# Patient Record
Sex: Female | Born: 1995
Health system: Southern US, Community
[De-identification: ages and names within clinical notes are randomized; demographics above are authoritative.]

---
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
## 2017-02-04 DIAGNOSIS — L700 Acne vulgaris: Secondary | ICD-10-CM | POA: Diagnosis not present

## 2017-05-14 DIAGNOSIS — L700 Acne vulgaris: Secondary | ICD-10-CM | POA: Diagnosis not present

## 2017-09-28 DIAGNOSIS — J029 Acute pharyngitis, unspecified: Secondary | ICD-10-CM | POA: Diagnosis not present

## 2018-11-03 ENCOUNTER — Telehealth: Payer: Self-pay | Admitting: Family Medicine

## 2018-11-03 NOTE — Telephone Encounter (Signed)
Hailey, what do you mean? Pt needs appointment has not been seen since 2018.

## 2018-11-03 NOTE — Telephone Encounter (Signed)
Please advise on what to do with pt.

## 2018-11-03 NOTE — Telephone Encounter (Signed)
Spoke to pt's mother Melinda Newman, told her Dr. Juleen China and Aldona Bar are both out of the office this week and can offer you an appointment with one of our other providers due to having panic attacks and anxiety. [name] said she would like to wait for Dr. Juleen China next week, pt has not had an attack in a little while.Told her okay I will have to get permission from Dr. Juleen China to schedule her and I will call you back on Monday. [name] verbalized understanding. Told her if patient has a severe panic attack or increase in anxiety that she can't handle please go to the ED or Urgent care to be treated. [name] verbalized understanding.

## 2018-11-03 NOTE — Telephone Encounter (Signed)
Correct, but I dont know if pt needs triaged.     Borenstein, Melinda Newman (Patient) Liese, Melinda Newman (Patient) Appointment Scheduling - Scheduling Inquiry for Clinic  Summary: appt  Reason for CRM: mom calling to make an appt for the pt for Dr Juleen China. Advised pt was a pt of Samantha. Mom states they only made this appt w/ samnatha because pt needed cpe for college. Mom and the other daughter sees Dr Juleen China.  Mom states pt is having panic attacks and anxiety. Pt ist [employer]. Mom is wanting pt to come in person to see Dr Juleen China.  Please advise if ok for pt to switch. Pt will take off work when ok for appt  Mom states ok to call pt to schedule or mom. Mom is dpr.  Pt is wanting appt asap.

## 2018-11-05 NOTE — Telephone Encounter (Signed)
Pt's mother [name] called back, told her Dr. Juleen China can see Melinda Newman today at 12:30. [name] said she will have to contact her she lives in [place] and see if she can make it and call right back. Told her okay.

## 2018-11-05 NOTE — Telephone Encounter (Signed)
Spoke to [place] told her Dr. Juleen China does not have anything Monday but can see her Tuesday at 8:00 am. [name] said okay will take that time. Appt scheduled.

## 2018-11-05 NOTE — Telephone Encounter (Signed)
Left message on voicemail to call office on pt's mobile and mother's mobile.

## 2018-11-05 NOTE — Telephone Encounter (Signed)
[name] called back and pt can not make today but is off Monday & Tuesday. Told her I will call back with a time. [name] verbalized understanding.

## 2018-11-09 ENCOUNTER — Ambulatory Visit (INDEPENDENT_AMBULATORY_CARE_PROVIDER_SITE_OTHER): Payer: BC Managed Care – PPO | Admitting: Family Medicine

## 2018-11-09 ENCOUNTER — Other Ambulatory Visit: Payer: Self-pay

## 2018-11-09 ENCOUNTER — Encounter: Payer: Self-pay | Admitting: Family Medicine

## 2018-11-09 VITALS — BP 122/85 | HR 70 | Temp 98.6°F | Ht 64.0 in | Wt 117.8 lb

## 2018-11-09 DIAGNOSIS — F411 Generalized anxiety disorder: Secondary | ICD-10-CM | POA: Diagnosis not present

## 2018-11-09 DIAGNOSIS — F410 Panic disorder [episodic paroxysmal anxiety] without agoraphobia: Secondary | ICD-10-CM | POA: Diagnosis not present

## 2018-11-09 MED ORDER — HYDROXYZINE HCL 25 MG PO TABS: 25.0000 mg | ORAL_TABLET | Freq: Three times a day (TID) | ORAL | 0 refills | Status: DC | PRN

## 2018-11-09 MED ORDER — JUNEL 1.5/30 1.5-30 MG-MCG PO TABS: 1.0000 | ORAL_TABLET | Freq: Every day | ORAL | 2 refills | Status: DC

## 2018-11-09 MED ORDER — SERTRALINE HCL 25 MG PO TABS: 25.0000 mg | ORAL_TABLET | Freq: Every day | ORAL | 1 refills | Status: DC

## 2018-11-09 NOTE — Progress Notes (Signed)
Melinda Newman is a 23 y.o. female is here for follow up.  History of Present Illness:   Barnie MortJoEllen Thompson, CMA acting as scribe for Dr. Helane RimaErica Yomayra Tate.   HPI: Patient has started having increased anxiety. She has had history of anxiety in the past. She did have what she thinks was a panic attack last week. Symptoms have increased recently.  Hx of anxiety, but worse over the past year. Used to run for exercise, but has not been able to since new position. Still gets in 10,000 steps. Previously on OCPs for acne. Never followed up with Dermatology, so stopped them months ago.  [place] in [place] One year left of college. 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   Health Maintenance Due  Topic Date Due  . CHLAMYDIA SCREENING  09/07/2012  . HIV Screening  09/07/2012  . PAP-Cervical Cytology Screening  09/08/2018  . PAP SMEAR-Modifier  09/08/2018   [facility] 11/09/2018 10/23/2016  Decreased Interest 0 0  Down, Depressed, Hopeless 1 0  PHQ - 2 Score 1 0  Altered sleeping 2 -  Tired, decreased energy 1 -  Change in appetite 0 -  Feeling bad or failure about yourself  2 -  Trouble concentrating 1 -  Moving slowly or fidgety/restless 1 -  Suicidal thoughts 0 -  PHQ-9 Score 8 -  Difficult doing work/chores Somewhat difficult -   PMHx, SurgHx, SocialHx, FamHx, Medications, and Allergies were reviewed in the Visit Navigator and updated as appropriate.  There are no active problems to display for this patient.  Social History   Tobacco Use  . Smoking status: Never Smoker  . Smokeless tobacco: Never Used  Substance Use Topics  . Alcohol use: Yes    Comment: socially  . Drug use: No   Current Medications and Allergies   None  No Known Allergies   Review of Systems   Pertinent items are noted in the HPI. Otherwise, a complete ROS is negative.  Vitals   Vitals:   11/09/18 0802  BP: 122/85  Pulse: 70  Temp: 98.6 F (37 C)  TempSrc: Oral  SpO2: 100%  Weight:  117 lb 12.8 oz (53.4 kg)  Height: 5\' 4"  (1.626 m)     Body mass index is 20.22 kg/m.  Physical Exam   Physical Exam Vitals signs and nursing note reviewed.  HENT:     Head: Normocephalic and atraumatic.  Eyes:     Pupils: Pupils are equal, round, and reactive to light.  Neck:     Musculoskeletal: Normal range of motion and neck supple.  Cardiovascular:     Rate and Rhythm: Normal rate and regular rhythm.     Heart sounds: Normal heart sounds.  Pulmonary:     Effort: Pulmonary effort is normal.  Abdominal:     Palpations: Abdomen is soft.  Skin:    General: Skin is warm.  Psychiatric:        Behavior: Behavior normal.    Assessment and Plan   [name] was seen today for anxiety.  Diagnoses and all orders for this visit:  GAD (generalized anxiety disorder) -     sertraline (ZOLOFT) 25 MG tablet; Take 1 tablet (25 mg total) by mouth at bedtime. -     JUNEL 1.5/30 1.5-30 MG-MCG tablet; Take 1 tablet by mouth daily.  Panic attacks -     hydrOXYzine (ATARAX/VISTARIL) 25 MG tablet; Take 1 tablet (25 mg total) by mouth every 8 (eight) hours as  needed for anxiety.   . Orders and follow up as documented in [place], reviewed diet, exercise and weight control, cardiovascular risk and specific lipid/LDL goals reviewed, reviewed medications and side effects in detail.  . Reviewed expectations re: course of current medical issues. . Outlined signs and symptoms indicating need for more acute intervention. . Patient verbalized understanding and all questions were answered. . Patient received an After Visit Summary.  CMA served as Education administrator during this visit. History, Physical, and Plan performed by medical provider. The above documentation has been reviewed and is accurate and complete. Briscoe Deutscher, D.O.  Briscoe Deutscher, DO [place], [place] [facility] 11/09/2018

## 2018-11-09 NOTE — Patient Instructions (Addendum)
Meditation Apps for Beginners  When the body feels emotional or psychological stress, it releases cortisol -- a stress hormone. Some science shows that slowing down breathing can interrupt anxiety by helping you recognize unhealthy thought patterns.1 Meditation is one avenue for doing that.  #1. Calm was recommended by many ADDitude readers, who say it's kid-friendly and great for a quick mood boost. The app has a free trial and offers 7- and 21-day programs that focus on topics like anxiety, joy, and gratitude. To access the full library of content, which is also available on Android, subscribe for $69.99 per year (or $399.99 buys you a lifetime subscription).  To help you fall asleep, check out the app's "Sleep Stories" function that features celebrities reading calming tales; one reader says the app's 'Calm Kids' is really wonderful, too. New during "this time of uncertainly" the company has curated additional free meditation tools and other resources on its website. Calm is compatible with most Apple and Android mobile devices.  #2. Headspace is a popular app offering short (5 minutes) and long (20 minutes) meditations for adults and kids. The content is organized according to themes such as calm, focus, kindness, and sleep -- and it is geared to specific age groups. Start your day with a dose of optimism in the "The Wake Up" feature or make your workout more meditative with the new "Move Mode." Subscriptions cost $12.99 monthly or $69.99 annually with a free 7-day trial.  #3. Insight Timer allows users to customize their meditation practice with an advanced timer function that features a variety of beautiful sounds like singing bowls, wood blocks, and bell chime. Select the length of time that suits your needs and the type of meditation you're seeking: spiritual, Zen, or transcendental meditations; meditations for children; walking, breathing, sleep, and more. This paid app costs $59.99 annually with  a 7-day free trial.  #4. The 21-Day Meditation Experience was created by celebrities Drexel Ihaprah Winfrey and Rodell PernaDeepak Chopra to "eradicate fear and doubt, and renew a sense of trust and optimism about your life and your world." The app is free, but has received an overall score of 3.3 stars out of 5 with some reports of glitches prior to the latest version, released earlier this year.  #5. The Simple Habit app helps users develop a regular meditation practice in just 5 minutes a day. Choose from a [employer] Millscomprehensive library of meditations led by a variety of teachers including former monks, Glass blower/designermindfulness coaches, Engineer, structuralyoga practitioners, and psychologists. Simply close your eyes and listen or access meditations to help you improve focus, boost self-confidence, and calm your anxious mind. If you suffer from panic attacks, this app offers special meditations to overcome them. This paid app costs $89.99 a year and offers a 7-day free trial.  Apps for Art, Music, and Ashby Dawesature Sounds The therapeutic value of making art and music is well documented in research as well as ADDitude readers' anecdotal experiences with the following tools designed to induce calm and focus.  #6. Pixel Art is an art-making app that offers the mental stimulation and relaxation of a coloring book in a more social setting. "It's like color-by-numbers, but one square at a time and it has simple pictures as well as very detailed ones," said one ADDitude reader. Pixilart touts itself as a new kind of social networking "developed to expand the art of learning and networking." This app costs $7.99 a week with a 3-day free trial.  #7. Tide is a free sleep, focus, and  meditation app that uses nature sounds to erase worry. Haines, forest, ocean, rainy window, fire, and cicadas are among the selections inspired by the great outdoors. The app claims to help procrastinators who struggle to stay focused, creatives who are disturbed by a noisy environment,  stressed people fighting anxiety, and meditators striving for peace in body and mind. Access a selection of inspiring, daily quotes through the app as well.  #8. Weightless is not an app, but a 10-hour piece of music available for free on YouTube featuring piano, guitar, and samples of natural soundscapes combined in an otherworldly -- and very calming -- arrangement that claims to be the most relaxing song on the planet. "Weightless" was produced in 2012 by the [place] band [employer], which worked with sound therapists to develop this soothing instrumental designed to reduce anxiety, lower blood pressure, and moderate heart rate, according to Dr. Neita Goodnight, a neuroscientist who studied it. (Previous research has shown that music is known to stimulate the regions in the brain responsible for processing emotions as well as sounds.)  Apps for Better Sleep Hygiene An anxious mind can disrupt sleep, and insufficient rest is linked to a host of chronic and serious health conditions including diabetes, mood disorders, high blood pressure, and heart disease. Perhaps then it's no surprise that these sleep-inducing apps topped the list for several ADDitude readers.  #9. Adrian Prince is a sleep-tracking app that monitors motion and sound to evaluate the quality of your sleep. The app collects data as you sleep and shows periods during the night when you experience light and deep phases of sleep. It also analyzes how dietary calories, caffeine, and alcohol consumption impact the quality of your sleep. Pillow costs $4.49 a month, or $27.49 a year with a 7-day free trial.  #10. Good Morning Alarm Clock is an easy-to-use app that provides novel insight -- namely, your natural waking phase when you're most likely to be alert and energized -- to help you understand the quality of your sleep and track your sleep debt. The paid app offers relaxing sounds to help you drift off at the end of the day and it wakes you up in  the morning -- to a playlist of your favorite songs, if you like -- at the most optimal time. This app costs $4.99 to download in the [employer].

## 2018-11-11 ENCOUNTER — Ambulatory Visit: Payer: Self-pay

## 2018-11-11 NOTE — Telephone Encounter (Addendum)
Incoming call from Pt. Complaining of having a panic attack and passing out.  Pt.  Reports feeling weak anxious .  Pt.  Has been feeling this way since this morning.  Denies want to hurt  Herself or harm anyone else.  Patient reports she has a lot going on in her life.  Hat sopped drinking caffeine. Reports having two beers last night.   Denies substancabuse .Encouraged Pt. To decrease caffiene. No alcohol.  Call back if Sx worsen.   Reason for Disposition . [1] Started on anti-anxiety medication AND [2] no relief  Answer Assessment - Initial Assessment Questions 1. CONCERN: "What happened that made you call today?"    Panic attack 2. ANXIETY SYMPTOM SCREENING: "Can you describe how you have been feeling?"  (e.g., tense, restless, panicky, anxious, keyed up, trouble sleeping, trouble concentrating)     Weak anxous, nausous 3. ONSET: "How long have you been feeling this way?"     This morning 4. RECURRENT: "Have you felt this way before?"  If yes: "What happened that time?" "What helped these feelings go away in the past?"     no 5. RISK OF HARM - SUICIDAL IDEATION:  "Do you ever have thoughts of hurting or killing yourself?"  (e.g., yes, no, no but preoccupation with thoughts about death)   - INTENT:  "Do you have thoughts of hurting or killing yourself right NOW?" (e.g., yes, no, N/A)   - PLAN: "Do you have a specific plan for how you would do this?" (e.g., gun, knife, overdose, no plan, N/A)     denies 6. RISK OF HARM - HOMICIDAL IDEATION:  "Do you ever have thoughts of hurting or killing someone else?"  (e.g., yes, no, no but preoccupation with thoughts about death)   - INTENT:  "Do you have thoughts of hurting or killing someone right NOW?" (e.g., yes, no, N/A)   - PLAN: "Do you have a specific plan for how you would do this?" (e.g., gun, knife, no plan, N/A)      deni 7. FUNCTIONAL IMPAIRMENT: "How have things been going for you overall in your life? Have you had any more difficulties than  usual doing your normal daily activities?"  (e.g., better, same, worse; self-care, school, work, interactions)     8. SUPPORT: "Who is with you now?" "Who do you live with?" "Do you have family or friends nearby who you can talk to?"      *No Answer* 9. THERAPIST: "Do you have a counselor or therapist? Name?"     denies 10. STRESSORS: "Has there been any new stress or recent changes in your life?"       A lot going on yes 11. CAFFEINE ABUSE: "Do you drink caffeinated beverages, and how much each day?" (e.g., coffee, tea, colas)       I did but I stopped over a week ago 12. SUBSTANCE ABUSE: "Do you use any illegal drugs or alcohol?"       denies 13. OTHER SYMPTOMS: "Do you have any other physical symptoms right now?" (e.g., chest pain, palpitations, difficulty breathing, fever)       Heart racing at times 14. PREGNANCY: "Is there any chance you are pregnant?" "When was your last menstrual period?"       Day after Fathers day  Protocols used: ANXIETY AND PANIC ATTACK-A-AH

## 2018-11-11 NOTE — Telephone Encounter (Signed)
FYI  Any need for f/u with you?

## 2018-11-12 ENCOUNTER — Ambulatory Visit: Payer: Self-pay

## 2018-11-12 NOTE — Telephone Encounter (Signed)
Please call and offer virtual appointment at patient's earliest convenience.

## 2018-11-12 NOTE — Telephone Encounter (Signed)
Patient called and says she has been taking the Zoloft for a few days and it's not helping with her anxiety. She says she's still anxious to the point she can't work due to the anxiety. She says she doesn't want to do anything. She denies any thoughts of wanting to harm herself. She asks what should she do about it? I asked is she taking the Hydralazine, she says it makes her sleepy, so she can't take it and work. I advised some anti-depressant medications will take a week or longer to get into the system and level out the imbalances that cause anxiety/depression, advised that the office is closed and I cannot advised on stopping or switching the medication. I advised to go to an UC or ED if symptoms worsen or have any thoughts to harm herself, she verbalize understanding. I advised someone from the office will call with Melinda Newman's recommendation when the office opens, she verbalized understanding.  Reason for Disposition . Caller has NON-URGENT medication question about med that PCP prescribed and triager unable to answer question  Protocols used: MEDICATION QUESTION CALL-A-AH

## 2018-11-13 DIAGNOSIS — Z6820 Body mass index (BMI) 20.0-20.9, adult: Secondary | ICD-10-CM | POA: Diagnosis not present

## 2018-11-13 DIAGNOSIS — F410 Panic disorder [episodic paroxysmal anxiety] without agoraphobia: Secondary | ICD-10-CM | POA: Diagnosis not present

## 2018-11-15 ENCOUNTER — Ambulatory Visit: Payer: BC Managed Care – PPO | Admitting: Physician Assistant

## 2018-11-15 NOTE — Telephone Encounter (Signed)
Left message on pt's voicemail to call office. Then I called pt's mother [name] told her I am trying to get in touch with Mel Almond from the message on Friday. [name] said pt did go to Urgent care and she did stop the medication. [name] said she and the pt do not feel she needs to be on something everyday just want something to take if she needs it. Told her if she talks to [place] to schedule appt with Aldona Bar to discuss and I left a message for her also. [name] verbalized understanding.

## 2018-11-15 NOTE — Telephone Encounter (Signed)
Left voicemail message on patient's home phone and cell phone requesting a call back.

## 2018-11-15 NOTE — Telephone Encounter (Signed)
See note

## 2018-11-16 ENCOUNTER — Ambulatory Visit (INDEPENDENT_AMBULATORY_CARE_PROVIDER_SITE_OTHER): Payer: BC Managed Care – PPO | Admitting: Physician Assistant

## 2018-11-16 ENCOUNTER — Encounter: Payer: Self-pay | Admitting: Physician Assistant

## 2018-11-16 DIAGNOSIS — F411 Generalized anxiety disorder: Secondary | ICD-10-CM | POA: Diagnosis not present

## 2018-11-16 MED ORDER — PROPRANOLOL HCL 20 MG PO TABS: 20.0000 mg | ORAL_TABLET | Freq: Three times a day (TID) | ORAL | 0 refills | Status: DC | PRN

## 2018-11-16 NOTE — Progress Notes (Signed)
Virtual Visit via Video   I connected with Melinda Newman on 11/16/18 at  9:20 AM EDT by a video enabled telemedicine application and verified that I am speaking with the correct person using two identifiers. Location patient: Home Location provider: [facility], Office Persons participating in the virtual visit: Melinda Newman, Melinda Mosco PA-C.  I discussed the limitations of evaluation and management by telemedicine and the availability of in person appointments. The patient expressed understanding and agreed to proceed.  I acted as a Neurosurgeonscribe for Energy East CorporationSamantha Amaad Byers, PA-C Melinda Orphanos, LPN  Subjective:   HPI:   Anxiety Patient seen for anxiety in our office a few weeks ago. She was having panic attacks and was put on Zoloft 25 mg daily as well as prn Atarax. Since that time she has had ongoing panic attacks. She went to an urgent care on 11/13/18 and was told that it was okay to stop Zoloft. She felt like it may have been making her anxiety worse. She was told to continue to use Atarax prn and to follow-up with us.  She is interested in counseling.  ROS: See pertinent positives and negatives per HPI.  There are no active problems to display for this patient.   Social History   Tobacco Use  . Smoking status: Never Smoker  . Smokeless tobacco: Never Used  Substance Use Topics  . Alcohol use: Yes    Comment: socially    Current Outpatient Medications:  .  hydrOXYzine (ATARAX/VISTARIL) 25 MG tablet, Take 1 tablet (25 mg total) by mouth every 8 (eight) hours as needed for anxiety., Disp: 20 tablet, Rfl: 0 .  JUNEL 1.5/30 1.5-30 MG-MCG tablet, Take 1 tablet by mouth daily. (Patient not taking: Reported on 11/16/2018), Disp: 3 Package, Rfl: 2 .  propranolol (INDERAL) 20 MG tablet, Take 1 tablet (20 mg total) by mouth 3 (three) times daily as needed (anxiety)., Disp: 30 tablet, Rfl: 0  No Known Allergies  Objective:   VITALS: Per patient if applicable, see vitals.  GENERAL: Alert, appears well and in no acute distress. HEENT: Atraumatic, conjunctiva clear, no obvious abnormalities on inspection of external nose and ears. NECK: Normal movements of the head and neck. CARDIOPULMONARY: No increased WOB. Speaking in clear sentences. I:E ratio WNL.  MS: Moves all visible extremities without noticeable abnormality. PSYCH: Pleasant and cooperative, well-groomed. Speech normal rate and rhythm. Affect is appropriate. Insight and judgement are appropriate. Attention is focused, linear, and appropriate.  NEURO: CN grossly intact. Oriented as arrived to appointment on time with no prompting. Moves both UE equally.  SKIN: No obvious lesions, wounds, erythema, or cyanosis noted on face or hands.  Assessment and Plan:   Melinda Newman was seen today for anxiety.  Diagnoses and all orders for this visit:  GAD (generalized anxiety disorder) Denies SI/HI at this time. I discussed with patient that if they develop any SI, to tell someone immediately and seek medical attention. We discussed trialing propranolol 20 mg prn for her anxiety. She is interested in this. She also has not tried Atarax without also being on Zoloft and is wondering if taking this without Zoloft will make it less sedating -- discussed that she may trial this but also may be too sedating. Psychiatry referral placed per patient request. Follow-up with us within 1 month, sooner if needed, if referral takes longer than that.  Other orders -     propranolol (INDERAL) 20 MG tablet; Take 1 tablet (20 mg total) by mouth 3 (  three) times daily as needed (anxiety).   . Reviewed expectations re: course of current medical issues. . Discussed self-management of symptoms. . Outlined signs and symptoms indicating need for more acute intervention. . Patient verbalized understanding and all questions were answered. Marland Kitchen Health Maintenance issues including appropriate healthy diet, exercise, and smoking avoidance were discussed  with patient. . See orders for this visit as documented in the electronic medical record.  I discussed the assessment and treatment plan with the patient. The patient was provided an opportunity to ask questions and all were answered. The patient agreed with the plan and demonstrated an understanding of the instructions.   The patient was advised to call back or seek an in-person evaluation if the symptoms worsen or if the condition fails to improve as anticipated.   CMA or LPN served as scribe during this visit. History, Physical, and Plan performed by medical provider. The above documentation has been reviewed and is accurate and complete.  Melinda Newman, [place] 11/16/2018

## 2018-12-04 DIAGNOSIS — Z6820 Body mass index (BMI) 20.0-20.9, adult: Secondary | ICD-10-CM | POA: Diagnosis not present

## 2018-12-04 DIAGNOSIS — R0789 Other chest pain: Secondary | ICD-10-CM | POA: Diagnosis not present

## 2018-12-04 DIAGNOSIS — R079 Chest pain, unspecified: Secondary | ICD-10-CM | POA: Diagnosis not present

## 2018-12-04 DIAGNOSIS — F419 Anxiety disorder, unspecified: Secondary | ICD-10-CM | POA: Diagnosis not present

## 2018-12-04 DIAGNOSIS — I4519 Other right bundle-branch block: Secondary | ICD-10-CM | POA: Diagnosis not present

## 2018-12-09 DIAGNOSIS — F064 Anxiety disorder due to known physiological condition: Secondary | ICD-10-CM | POA: Diagnosis not present

## 2018-12-09 DIAGNOSIS — F410 Panic disorder [episodic paroxysmal anxiety] without agoraphobia: Secondary | ICD-10-CM | POA: Diagnosis not present

## 2018-12-16 DIAGNOSIS — F064 Anxiety disorder due to known physiological condition: Secondary | ICD-10-CM | POA: Diagnosis not present

## 2018-12-16 DIAGNOSIS — F410 Panic disorder [episodic paroxysmal anxiety] without agoraphobia: Secondary | ICD-10-CM | POA: Diagnosis not present

## 2018-12-23 DIAGNOSIS — F064 Anxiety disorder due to known physiological condition: Secondary | ICD-10-CM | POA: Diagnosis not present

## 2018-12-23 DIAGNOSIS — F410 Panic disorder [episodic paroxysmal anxiety] without agoraphobia: Secondary | ICD-10-CM | POA: Diagnosis not present

## 2018-12-28 DIAGNOSIS — F419 Anxiety disorder, unspecified: Secondary | ICD-10-CM | POA: Diagnosis not present

## 2018-12-30 DIAGNOSIS — F064 Anxiety disorder due to known physiological condition: Secondary | ICD-10-CM | POA: Diagnosis not present

## 2018-12-30 DIAGNOSIS — F410 Panic disorder [episodic paroxysmal anxiety] without agoraphobia: Secondary | ICD-10-CM | POA: Diagnosis not present

## 2019-01-06 DIAGNOSIS — F064 Anxiety disorder due to known physiological condition: Secondary | ICD-10-CM | POA: Diagnosis not present

## 2019-01-06 DIAGNOSIS — F410 Panic disorder [episodic paroxysmal anxiety] without agoraphobia: Secondary | ICD-10-CM | POA: Diagnosis not present

## 2019-01-13 DIAGNOSIS — F064 Anxiety disorder due to known physiological condition: Secondary | ICD-10-CM | POA: Diagnosis not present

## 2019-01-13 DIAGNOSIS — F410 Panic disorder [episodic paroxysmal anxiety] without agoraphobia: Secondary | ICD-10-CM | POA: Diagnosis not present

## 2019-01-20 DIAGNOSIS — F064 Anxiety disorder due to known physiological condition: Secondary | ICD-10-CM | POA: Diagnosis not present

## 2019-01-20 DIAGNOSIS — F410 Panic disorder [episodic paroxysmal anxiety] without agoraphobia: Secondary | ICD-10-CM | POA: Diagnosis not present

## 2019-01-30 DIAGNOSIS — F064 Anxiety disorder due to known physiological condition: Secondary | ICD-10-CM | POA: Diagnosis not present

## 2019-01-30 DIAGNOSIS — F410 Panic disorder [episodic paroxysmal anxiety] without agoraphobia: Secondary | ICD-10-CM | POA: Diagnosis not present

## 2019-02-10 DIAGNOSIS — F064 Anxiety disorder due to known physiological condition: Secondary | ICD-10-CM | POA: Diagnosis not present

## 2019-02-10 DIAGNOSIS — F410 Panic disorder [episodic paroxysmal anxiety] without agoraphobia: Secondary | ICD-10-CM | POA: Diagnosis not present

## 2019-02-17 DIAGNOSIS — J039 Acute tonsillitis, unspecified: Secondary | ICD-10-CM | POA: Diagnosis not present

## 2019-02-22 DIAGNOSIS — J038 Acute tonsillitis due to other specified organisms: Secondary | ICD-10-CM | POA: Diagnosis not present

## 2019-02-24 DIAGNOSIS — F410 Panic disorder [episodic paroxysmal anxiety] without agoraphobia: Secondary | ICD-10-CM | POA: Diagnosis not present

## 2019-02-24 DIAGNOSIS — F064 Anxiety disorder due to known physiological condition: Secondary | ICD-10-CM | POA: Diagnosis not present

## 2019-03-15 DIAGNOSIS — J029 Acute pharyngitis, unspecified: Secondary | ICD-10-CM | POA: Diagnosis not present

## 2019-03-15 DIAGNOSIS — R0982 Postnasal drip: Secondary | ICD-10-CM | POA: Diagnosis not present

## 2019-04-06 ENCOUNTER — Encounter (HOSPITAL_BASED_OUTPATIENT_CLINIC_OR_DEPARTMENT_OTHER): Payer: Self-pay

## 2019-04-06 ENCOUNTER — Emergency Department (HOSPITAL_BASED_OUTPATIENT_CLINIC_OR_DEPARTMENT_OTHER): Payer: BC Managed Care – PPO

## 2019-04-06 ENCOUNTER — Other Ambulatory Visit: Payer: Self-pay

## 2019-04-06 ENCOUNTER — Emergency Department (HOSPITAL_BASED_OUTPATIENT_CLINIC_OR_DEPARTMENT_OTHER)
Admission: EM | Admit: 2019-04-06 | Discharge: 2019-04-07 | Disposition: A | Discharge status: Home / Self Care | Payer: BC Managed Care – PPO | Attending: Emergency Medicine | Admitting: Emergency Medicine

## 2019-04-06 DIAGNOSIS — R079 Chest pain, unspecified: Secondary | ICD-10-CM | POA: Diagnosis not present

## 2019-04-06 DIAGNOSIS — R9431 Abnormal electrocardiogram [ECG] [EKG]: Secondary | ICD-10-CM | POA: Diagnosis not present

## 2019-04-06 DIAGNOSIS — J982 Interstitial emphysema: Secondary | ICD-10-CM | POA: Insufficient documentation

## 2019-04-06 LAB — CBC WITH DIFFERENTIAL/PLATELET
Abs Immature Granulocytes: 0.02 10*3/uL (ref 0.00–0.07)
Basophils Absolute: 0 10*3/uL (ref 0.0–0.1)
Basophils Relative: 0 %
Eosinophils Absolute: 0.1 10*3/uL (ref 0.0–0.5)
Eosinophils Relative: 1 %
HCT: 38.4 % (ref 36.0–46.0)
Hemoglobin: 13 g/dL (ref 12.0–15.0)
Immature Granulocytes: 0 %
Lymphocytes Relative: 21 %
Lymphs Abs: 2.2 10*3/uL (ref 0.7–4.0)
MCH: 30.8 pg (ref 26.0–34.0)
MCHC: 33.9 g/dL (ref 30.0–36.0)
MCV: 91 fL (ref 80.0–100.0)
Monocytes Absolute: 1 10*3/uL (ref 0.1–1.0)
Monocytes Relative: 9 %
Neutro Abs: 7.1 10*3/uL (ref 1.7–7.7)
Neutrophils Relative %: 69 %
Platelets: 366 10*3/uL (ref 150–400)
RBC: 4.22 MIL/uL (ref 3.87–5.11)
RDW: 11.9 % (ref 11.5–15.5)
WBC: 10.4 10*3/uL (ref 4.0–10.5)
nRBC: 0 % (ref 0.0–0.2)

## 2019-04-06 LAB — BASIC METABOLIC PANEL
Anion gap: 6 (ref 5–15)
BUN: 9 mg/dL (ref 6–20)
CO2: 24 mmol/L (ref 22–32)
Calcium: 9.4 mg/dL (ref 8.9–10.3)
Chloride: 109 mmol/L (ref 98–111)
Creatinine, Ser: 0.74 mg/dL (ref 0.44–1.00)
GFR calc Af Amer: >60 mL/min (ref >60)
GFR calc non Af Amer: >60 mL/min (ref >60)
Glucose, Bld: 103 mg/dL — ABNORMAL HIGH (ref 70–99)
Potassium: 4 mmol/L (ref 3.5–5.1)
Sodium: 139 mmol/L (ref 135–145)

## 2019-04-06 MED ORDER — LORAZEPAM 2 MG/ML IJ SOLN
1.0000 mg | Freq: Once | INTRAMUSCULAR | Status: AC
  Administered 2019-04-06: 1 mg via INTRAVENOUS
  Filled 2019-04-06: qty 1

## 2019-04-06 MED ORDER — IOHEXOL 300 MG/ML  SOLN
100.0000 mL | Freq: Once | INTRAMUSCULAR | Status: AC | PRN
  Administered 2019-04-07: 80 mL via INTRAVENOUS

## 2019-04-06 MED ORDER — MORPHINE SULFATE (PF) 4 MG/ML IV SOLN
4.0000 mg | Freq: Once | INTRAVENOUS | Status: AC
  Administered 2019-04-06: 4 mg via INTRAVENOUS
  Filled 2019-04-06: qty 1

## 2019-04-06 NOTE — ED Provider Notes (Signed)
[facility] EMERGENCY DEPARTMENT Provider Note   CSN: [id number] Arrival date & time: 04/06/19  2102     History   Chief Complaint Chief Complaint  Patient presents with  . Chest Pain    HPI Melinda Newman is a 23 y.o. female.     HPI   23 year old female with chest pain.  Onset around 3 PM while driving her car and eating cheese its.  She describes pain in the center of her chest coming up into her neck.  She feels fine when she is completely still.  Somewhat worse with movement in general and sniffily worse with deep inhalation.  She does not feel short of breath.  No fevers or chills.  No coughing.  No unusual leg pain or swelling.  Is a past history of anxiety.  She tried taking dose of propranolol without improvement of her symptoms.  She did not feel like her heart was racing though.  History reviewed. No pertinent past medical history.  There are no active problems to display for this patient.   History reviewed. No pertinent surgical history.   OB History   No obstetric history on file.      Home Medications    Prior to Admission medications   Medication Sig Start Date End Date Taking? Authorizing Provider  hydrOXYzine (ATARAX/VISTARIL) 25 MG tablet Take 1 tablet (25 mg total) by mouth every 8 (eight) hours as needed for anxiety. 11/09/18   Helane Rima, DO  JUNEL 1.5/30 1.5-30 MG-MCG tablet Take 1 tablet by mouth daily. Patient not taking: Reported on 11/16/2018 11/09/18   Helane Rima, DO  propranolol (INDERAL) 20 MG tablet Take 1 tablet (20 mg total) by mouth 3 (three) times daily as needed (anxiety). 11/16/18   Jarold Motto, PA    Family History Family History  Problem Relation Age of Onset  . Multiple sclerosis Mother   . Stroke Maternal Grandmother   . Skin cancer Maternal Grandfather   . Diabetes Maternal Grandfather   . Heart disease Maternal Grandfather   . Skin cancer Paternal Grandmother   . Skin cancer Paternal Grandfather    . Colon cancer Neg Hx   . Breast cancer Neg Hx     Social History Social History   Tobacco Use  . Smoking status: Never Smoker  . Smokeless tobacco: Never Used  Substance Use Topics  . Alcohol use: Yes    Comment: occ  . Drug use: No     Allergies   Patient has no known allergies.   Review of Systems Review of Systems  All systems reviewed and negative, other than as noted in HPI.  Physical Exam Updated Vital Signs BP 109/77 (BP Location: Right Arm)   Pulse 62   Temp 98.8 F (37.1 C) (Oral)   Resp 18   Ht 5\' 4"  (1.626 m)   Wt 52.2 kg   SpO2 100%   BMI 19.74 kg/m   Physical Exam Vitals signs and nursing note reviewed.  Constitutional:      General: She is not in acute distress.    Appearance: She is well-developed.  HENT:     Head: Normocephalic and atraumatic.  Eyes:     General:        Right eye: No discharge.        Left eye: No discharge.     Conjunctiva/sclera: Conjunctivae normal.  Neck:     Musculoskeletal: Neck supple.  Cardiovascular:     Rate and Rhythm: Normal rate  and regular rhythm.     Heart sounds: Normal heart sounds. No murmur. No friction rub. No gallop.   Pulmonary:     Effort: Pulmonary effort is normal. No respiratory distress.     Breath sounds: Normal breath sounds.  Abdominal:     General: There is no distension.     Palpations: Abdomen is soft.     Tenderness: There is no abdominal tenderness.  Musculoskeletal:        General: No tenderness.  Skin:    General: Skin is warm and dry.  Neurological:     Mental Status: She is alert.  Psychiatric:        Behavior: Behavior normal.        Thought Content: Thought content normal.    ED Treatments / Results  Labs (all labs ordered are listed, but only abnormal results are displayed) Labs Reviewed  CBC WITH DIFFERENTIAL/PLATELET  BASIC METABOLIC PANEL    EKG EKG Interpretation  Date/Time:  Wednesday April 06 2019 21:12:49 EST Ventricular Rate:  65 PR Interval:   138 QRS Duration: 104 QT Interval:  370 QTC Calculation: 384 R Axis:   45 Text Interpretation: Normal sinus rhythm Incomplete right bundle branch block Cannot rule out Anterior infarct , age undetermined Abnormal ECG Confirmed by Virgel Manifold [id number]) on 04/06/2019 9:21:30 PM   Radiology No results found.  Procedures Procedures (including critical care time)  Medications Ordered in ED Medications  LORazepam (ATIVAN) injection 1 mg (has no administration in time range)  morphine 4 MG/ML injection 4 mg (has no administration in time range)     Initial Impression / Assessment and Plan / ED Course  I have reviewed the triage vital signs and the nursing notes.  Pertinent labs & imaging results that were available during my care of the patient were reviewed by me and considered in my medical decision making (see chart for details).    23yF with chest pain.  Interestingly she has spontaneous pneumomediastinum. I can't readily identify a clear precipitant though.  Denies any significant coughing, retching or trauma.  Smokes very rarely when she drinks alcohol. Denies any vaping/drug use. Says she was just driving in her car eating cheez-its when she first noticed it. No gagging, severe pain, etc. I really doubt she perforated her esophagus. Aside from anxiety, denies any significant medical issues.  On exam she appears well. WOB is not increased and she seems comfortable at rest. o2 sats normal on RA. No chest/neck crepitus or tenderness. Lungs sound clear.   Will CT to further evaluate. Care signed out to Dr [facility] with CT pending.   Final Clinical Impressions(s) / ED Diagnoses   Final diagnoses:  Pneumomediastinum [facility])    ED Discharge Orders    None       Virgel Manifold, MD 04/07/19 0002

## 2019-04-06 NOTE — ED Triage Notes (Addendum)
Pt c/o CP since 3pm while driving-pt states she has hx of same with dx anxiety-pt states she took propanolol and advil PTA-denies fever/flu like sx-NAD-steady gait

## 2019-04-06 NOTE — ED Notes (Signed)
Pt reports worsening of pain. Has not seen EDP yet, apologized for wait and explained.

## 2019-04-07 ENCOUNTER — Emergency Department (HOSPITAL_BASED_OUTPATIENT_CLINIC_OR_DEPARTMENT_OTHER): Payer: BC Managed Care – PPO

## 2019-04-07 DIAGNOSIS — J982 Interstitial emphysema: Secondary | ICD-10-CM | POA: Diagnosis not present

## 2019-04-07 MED ORDER — HYDROCODONE-ACETAMINOPHEN 5-325 MG PO TABS: 1.0000 | ORAL_TABLET | ORAL | 0 refills | Status: DC | PRN

## 2019-04-07 MED ORDER — HYDROCODONE-ACETAMINOPHEN 5-325 MG PO TABS
1.0000 | ORAL_TABLET | Freq: Once | ORAL | Status: AC
  Administered 2019-04-07: 1 via ORAL
  Filled 2019-04-07: qty 1

## 2019-04-07 NOTE — ED Notes (Signed)
Patient transported to CT 

## 2019-04-07 NOTE — ED Provider Notes (Signed)
Nursing notes and vitals signs, including pulse oximetry, reviewed.  Summary of this visit's results, reviewed by myself:  EKG:  EKG Interpretation  Date/Time:  Wednesday April 06 2019 21:12:49 EST Ventricular Rate:  65 PR Interval:  138 QRS Duration: 104 QT Interval:  370 QTC Calculation: 384 R Axis:   45 Text Interpretation: Normal sinus rhythm Incomplete right bundle branch block Cannot rule out Anterior infarct , age undetermined Abnormal ECG Confirmed by Raeford Razor [phone]) on 04/06/2019 9:21:30 PM       Labs:  Results for orders placed or performed during the hospital encounter of 04/06/19 (from the past 24 hour(s))  CBC with Differential     Status: None   Collection Time: 04/06/19 11:31 PM  Result Value Ref Range   WBC 10.4 4.0 - 10.5 K/uL   RBC 4.22 3.87 - 5.11 MIL/uL   Hemoglobin 13.0 12.0 - 15.0 g/dL   HCT 37.1 06.2 - 69.4 %   MCV 91.0 80.0 - 100.0 fL   MCH 30.8 26.0 - 34.0 pg   MCHC 33.9 30.0 - 36.0 g/dL   RDW 85.4 62.7 - 03.5 %   Platelets 366 150 - 400 K/uL   nRBC 0.0 0.0 - 0.2 %   Neutrophils Relative % 69 %   Neutro Abs 7.1 1.7 - 7.7 K/uL   Lymphocytes Relative 21 %   Lymphs Abs 2.2 0.7 - 4.0 K/uL   Monocytes Relative 9 %   Monocytes Absolute 1.0 0.1 - 1.0 K/uL   Eosinophils Relative 1 %   Eosinophils Absolute 0.1 0.0 - 0.5 K/uL   Basophils Relative 0 %   Basophils Absolute 0.0 0.0 - 0.1 K/uL   Immature Granulocytes 0 %   Abs Immature Granulocytes 0.02 0.00 - 0.07 K/uL  Basic metabolic panel     Status: Abnormal   Collection Time: 04/06/19 11:31 PM  Result Value Ref Range   Sodium 139 135 - 145 mmol/L   Potassium 4.0 3.5 - 5.1 mmol/L   Chloride 109 98 - 111 mmol/L   CO2 24 22 - 32 mmol/L   Glucose, Bld 103 (H) 70 - 99 mg/dL   BUN 9 6 - 20 mg/dL   Creatinine, Ser 0.09 0.44 - 1.00 mg/dL   Calcium 9.4 8.9 - 38.1 mg/dL   GFR calc non Af Amer >60 >60 mL/min   GFR calc Af Amer >60 >60 mL/min   Anion gap 6 5 - 15    Imaging Studies: Dg  Chest 2 View  Result Date: 04/06/2019 CLINICAL DATA:  Sternal chest pain, initial encounter EXAM: CHEST - 2 VIEW COMPARISON:  None. FINDINGS: Cardiac shadow is within normal limits. There are changes consistent with pneumomediastinum without evidence of pneumothorax. The lungs are clear. No bony abnormality is noted. IMPRESSION: Changes consistent with pneumomediastinum radiating into the neck. This may be related to barotrauma or recent vomiting. Clinical correlation is recommended. CT may be helpful for further evaluation. Critical Value/emergent results were called by telephone at the time of interpretation on 04/06/2019 at 10:52 pm to Dr. Raeford Razor , who verbally acknowledged these results. Electronically Signed   By: Alcide Clever M.D.   On: 04/06/2019 22:50   Ct Chest Wo Contrast  Result Date: 04/07/2019 CLINICAL DATA:  Known pneumomediastinum, evaluate for possible esophageal injury EXAM: CT CHEST WITHOUT CONTRAST TECHNIQUE: Multidetector CT imaging of the chest was performed following the standard protocol without IV contrast. Additionally Gastrografin was administered orally during the exam. COMPARISON:  Contrast enhanced CT from  earlier in the same day. FINDINGS: Cardiovascular: Cardiac shadow is within normal limits. No IV contrast was administered for this exam. No other focal vascular abnormality is noted. Mediastinum/Nodes: Pneumomediastinum is again identified. The esophagus shows dense Gastrografin contrast within although no area of extravasation is identified. Repeat imaging to include the entire gastroesophageal junction was performed. These images show no extravasation of contrast material. Lungs/Pleura: The lungs are again well aerated without focal infiltrate or sizable effusion. No pneumothorax is seen. Upper Abdomen: Visualized upper abdomen is limited due to the lack of IV contrast. The stomach is full of ingested food stuffs. Musculoskeletal: No acute bony abnormality is noted.  IMPRESSION: No evidence of significant extravasation of ingested contrast to suggest esophageal injury. Pneumomediastinum is again identified and stable. Electronically Signed   By: Inez Catalina M.D.   On: 04/07/2019 01:49   Ct Chest W Contrast  Result Date: 04/07/2019 CLINICAL DATA:  Pneumomediastinum on recent chest x-ray EXAM: CT CHEST WITH CONTRAST TECHNIQUE: Multidetector CT imaging of the chest was performed during intravenous contrast administration. CONTRAST:  71mL OMNIPAQUE IOHEXOL 300 MG/ML  SOLN COMPARISON:  Chest x-ray from the previous day FINDINGS: Cardiovascular: Thoracic aorta and its branches are within normal limits. No aneurysmal dilatation is seen. No cardiac enlargement is noted. The pulmonary artery as visualized is within normal limits. Mediastinum/Nodes: The thoracic inlet demonstrates considerable subcutaneous emphysema consistent with the pneumomediastinum seen on recent chest x-ray. No mediastinal hematoma is seen. No hilar or mediastinal adenopathy is noted. Pneumomediastinum is noted as well along the esophagus as well as the anterior mediastinum. No definitive esophageal abnormality is seen. No significant extension of the pneumomediastinum along the bronchial tree is seen to suggest bronchial trauma. Lungs/Pleura: The lungs are well aerated bilaterally. No focal infiltrate, effusion or pneumothorax is seen. No parenchymal nodules are noted. Upper Abdomen: No acute abnormality. Musculoskeletal: No acute bony abnormality is noted. IMPRESSION: Changes consistent with pneumomediastinum extending into the neck without definitive % potato abnormality. No findings to suggest bronchial trauma or esophageal trauma are seen. No other focal abnormality is noted. Electronically Signed   By: Inez Catalina M.D.   On: 04/07/2019 00:26   Discussed with cardiothoracic surgeon on call.  He recommends outpatient treatment given no evidence of esophageal rupture.  Antibiotics are not  recommended.     Carlota Philley, Jenny Reichmann, MD 04/07/19 [phone]

## 2019-04-12 ENCOUNTER — Ambulatory Visit (INDEPENDENT_AMBULATORY_CARE_PROVIDER_SITE_OTHER): Payer: BC Managed Care – PPO | Admitting: Physician Assistant

## 2019-04-12 ENCOUNTER — Encounter: Payer: Self-pay | Admitting: Physician Assistant

## 2019-04-12 VITALS — Ht 64.0 in | Wt 115.0 lb

## 2019-04-12 DIAGNOSIS — F411 Generalized anxiety disorder: Secondary | ICD-10-CM

## 2019-04-12 DIAGNOSIS — K2900 Acute gastritis without bleeding: Secondary | ICD-10-CM | POA: Diagnosis not present

## 2019-04-12 DIAGNOSIS — J982 Interstitial emphysema: Secondary | ICD-10-CM | POA: Diagnosis not present

## 2019-04-12 MED ORDER — ESOMEPRAZOLE MAGNESIUM 40 MG PO CPDR: 40.0000 mg | DELAYED_RELEASE_CAPSULE | Freq: Every day | ORAL | 3 refills | Status: DC

## 2019-04-12 MED ORDER — PROPRANOLOL HCL 20 MG PO TABS: 20.0000 mg | ORAL_TABLET | Freq: Three times a day (TID) | ORAL | 0 refills | Status: DC | PRN

## 2019-04-12 MED ORDER — NORETHINDRONE ACET-ETHINYL EST 1.5-30 MG-MCG PO TABS: 1.0000 | ORAL_TABLET | Freq: Every day | ORAL | 3 refills | Status: DC

## 2019-04-12 NOTE — Progress Notes (Signed)
Virtual Visit via Video   I connected with Melinda Newman on 04/12/19 at 11:40 AM EST by a video enabled telemedicine application and verified that I am speaking with the correct person using two identifiers. Location patient: Home Location provider: [facility], Office Persons participating in the virtual visit: Kaitl[place], Iversen PA-C, Anselmo Pickler, LPN   I discussed the limitations of evaluation and management by telemedicine and the availability of in person appointments. The patient expressed understanding and agreed to proceed.  I acted as a Education administrator for [employer], PA-C [employer], LPN  Subjective:   HPI:   Pneumomediastinum Patient is s/p ER visit for pneumomediastinum on 04/06/19. She had chest pain earlier that day while eating Cheez-Its. She had a CXR that showed pneumomediastinum radiating into the neck. CT chest did not suggest esophageal injury. Labs were essentially normal. Symptoms have resolved. Denies: chest pain, SOB, painful swallowing.  Stomach issues Pt c/o decreased appetite, nausea after eating, cramps and diarrhea. Pt said has been worse the past month and is concerned. Pt has tried Tums with some relief. Denies: rectal bleeding, unintentional weight loss, night sweats. Denies known food triggers or excessive ETOH intake. States that she completed a couple rounds of antibiotics for URI and her symptoms started after that. Has been taking a bit more ibuprofen than usual due to headaches, estimates maybe taking about 10 over the past two weeks or so.  Anxiety Pt having increase in anxiety off and on lately due to school and ED visit. She has been taking Propranolol as needed. Takes it when her heart is racing, ran out of HydrOxyzine. Propranolol works well for her, she didn't know that she could take it multiple times a day. She tried to see a Social worker but didn't feel like it was helpful. Denies SI/HI.   ROS: See pertinent positives  and negatives per HPI.  There are no active problems to display for this patient.   Social History   Tobacco Use  . Smoking status: Never Smoker  . Smokeless tobacco: Never Used  Substance Use Topics  . Alcohol use: Yes    Comment: occ    Current Outpatient Medications:  .  HYDROcodone-acetaminophen (NORCO) 5-325 MG tablet, Take 1 tablet by mouth every 4 (four) hours as needed for severe pain., Disp: 30 tablet, Rfl: 0 .  Norethindrone Acetate-Ethinyl Estradiol (JUNEL 1.5/30) 1.5-30 MG-MCG tablet, Take 1 tablet by mouth daily., Disp: 3 Package, Rfl: 3 .  propranolol (INDERAL) 20 MG tablet, Take 1 tablet (20 mg total) by mouth 3 (three) times daily as needed (anxiety)., Disp: 30 tablet, Rfl: 0 .  esomeprazole (NEXIUM) 40 MG capsule, Take 1 capsule (40 mg total) by mouth daily., Disp: 30 capsule, Rfl: 3 .  hydrOXYzine (ATARAX/VISTARIL) 25 MG tablet, Take 1 tablet (25 mg total) by mouth every 8 (eight) hours as needed for anxiety. (Patient not taking: Reported on 04/12/2019), Disp: 20 tablet, Rfl: 0  No Known Allergies  Objective:   VITALS: Per patient if applicable, see vitals. GENERAL: Alert, appears well and in no acute distress. HEENT: Atraumatic, conjunctiva clear, no obvious abnormalities on inspection of external nose and ears. NECK: Normal movements of the head and neck. CARDIOPULMONARY: No increased WOB. Speaking in clear sentences. I:E ratio WNL.  MS: Moves all visible extremities without noticeable abnormality. PSYCH: Pleasant and cooperative, well-groomed. Speech normal rate and rhythm. Affect is appropriate. Insight and judgement are appropriate. Attention is focused, linear, and appropriate.  NEURO: CN grossly  intact. Oriented as arrived to appointment on time with no prompting. Moves both UE equally.  SKIN: No obvious lesions, wounds, erythema, or cyanosis noted on face or hands.  Assessment and Plan:   Melinda Newman was seen today for anxiety and stomach issues.  Diagnoses  and all orders for this visit:  Pneumomediastinum (HCC) Resolved. Worsening precautions advised.  GAD (generalized anxiety disorder) Uncontrolled. I'm hesitant to start new medication with her because she trialed zoloft and had significantly worsening of symptoms. Discussed trialing propranolol 2-3 times a day to see if this can manage her symptoms. Follow-up in 1 month, sooner if concerns.  Acute gastritis without hemorrhage, unspecified gastritis type No red flags on discussion. Suspect gastritis and possible issues with change in gut flora from recent abx usage. Recommended OTC probiotic, oral nexium and avoiding high fat/seasoned foods. Follow-up if no improvement of symptoms.   Other orders -     propranolol (INDERAL) 20 MG tablet; Take 1 tablet (20 mg total) by mouth 3 (three) times daily as needed (anxiety). -     esomeprazole (NEXIUM) 40 MG capsule; Take 1 capsule (40 mg total) by mouth daily. -     Norethindrone Acetate-Ethinyl Estradiol (JUNEL 1.5/30) 1.5-30 MG-MCG tablet; Take 1 tablet by mouth daily.    . Reviewed expectations re: course of current medical issues. . Discussed self-management of symptoms. . Outlined signs and symptoms indicating need for more acute intervention. . Patient verbalized understanding and all questions were answered. Marland Kitchen Health Maintenance issues including appropriate healthy diet, exercise, and smoking avoidance were discussed with patient. . See orders for this visit as documented in the electronic medical record.  I discussed the assessment and treatment plan with the patient. The patient was provided an opportunity to ask questions and all were answered. The patient agreed with the plan and demonstrated an understanding of the instructions.   The patient was advised to call back or seek an in-person evaluation if the symptoms worsen or if the condition fails to improve as anticipated.   CMA or LPN served as scribe during this visit. History,  Physical, and Plan performed by medical provider. The above documentation has been reviewed and is accurate and complete.   [place], [place] 04/12/2019

## 2019-04-14 DIAGNOSIS — R591 Generalized enlarged lymph nodes: Secondary | ICD-10-CM | POA: Diagnosis not present

## 2019-04-14 DIAGNOSIS — J039 Acute tonsillitis, unspecified: Secondary | ICD-10-CM | POA: Diagnosis not present

## 2019-04-16 DIAGNOSIS — J039 Acute tonsillitis, unspecified: Secondary | ICD-10-CM | POA: Diagnosis not present

## 2019-04-16 DIAGNOSIS — R509 Fever, unspecified: Secondary | ICD-10-CM | POA: Diagnosis not present

## 2019-04-16 DIAGNOSIS — R1110 Vomiting, unspecified: Secondary | ICD-10-CM | POA: Diagnosis not present

## 2019-04-16 DIAGNOSIS — J029 Acute pharyngitis, unspecified: Secondary | ICD-10-CM | POA: Diagnosis not present

## 2019-04-16 DIAGNOSIS — Z20828 Contact with and (suspected) exposure to other viral communicable diseases: Secondary | ICD-10-CM | POA: Diagnosis not present

## 2019-06-07 DIAGNOSIS — J020 Streptococcal pharyngitis: Secondary | ICD-10-CM | POA: Diagnosis not present

## 2019-06-07 DIAGNOSIS — Z6820 Body mass index (BMI) 20.0-20.9, adult: Secondary | ICD-10-CM | POA: Diagnosis not present

## 2019-06-07 DIAGNOSIS — J029 Acute pharyngitis, unspecified: Secondary | ICD-10-CM | POA: Diagnosis not present

## 2019-06-27 ENCOUNTER — Other Ambulatory Visit: Payer: Self-pay | Admitting: *Deleted

## 2019-06-27 MED ORDER — PROPRANOLOL HCL 20 MG PO TABS: 20.0000 mg | ORAL_TABLET | Freq: Three times a day (TID) | ORAL | 0 refills | Status: DC | PRN

## 2019-06-29 ENCOUNTER — Ambulatory Visit: Payer: BC Managed Care – PPO | Admitting: Physician Assistant

## 2019-06-29 DIAGNOSIS — R070 Pain in throat: Secondary | ICD-10-CM | POA: Diagnosis not present

## 2019-06-29 DIAGNOSIS — J029 Acute pharyngitis, unspecified: Secondary | ICD-10-CM | POA: Diagnosis not present

## 2019-06-29 DIAGNOSIS — Z6820 Body mass index (BMI) 20.0-20.9, adult: Secondary | ICD-10-CM | POA: Diagnosis not present

## 2019-07-07 ENCOUNTER — Other Ambulatory Visit: Payer: Self-pay | Admitting: *Deleted

## 2019-07-07 MED ORDER — ESOMEPRAZOLE MAGNESIUM 40 MG PO CPDR: 40.0000 mg | DELAYED_RELEASE_CAPSULE | Freq: Every day | ORAL | 5 refills | Status: DC

## 2019-08-28 ENCOUNTER — Other Ambulatory Visit: Payer: Self-pay | Admitting: Physician Assistant

## 2019-10-03 NOTE — Progress Notes (Signed)
Phone [phone] In person visit   Subjective:   Melinda Newman is a 24 y.o. year old very pleasant female patient who presents for/with See problem oriented charting Chief Complaint  Patient presents with  . Stomach issues  . Anxiety   This visit occurred during the SARS-CoV-2 public health emergency.  Safety protocols were in place, including screening questions prior to the visit, additional usage of staff PPE, and extensive cleaning of exam room while observing appropriate contact time as indicated for disinfecting solutions.   Past Medical History-  Patient Active Problem List   Diagnosis Date Noted  . GAD (generalized anxiety disorder) 04/12/2019    Medications- reviewed and updated Current Outpatient Medications  Medication Sig Dispense Refill  . Norethindrone Acetate-Ethinyl Estradiol (JUNEL 1.5/30) 1.5-30 MG-MCG tablet Take 1 tablet by mouth daily. 3 Package 3  . propranolol (INDERAL) 20 MG tablet TAKE 1 TABLET (20 MG TOTAL) BY MOUTH 3 (THREE) TIMES DAILY AS NEEDED (ANXIETY). 30 tablet 0   No current facility-administered medications for this visit.     Objective:  BP 118/70 (BP Location: Left Arm, Patient Position: Sitting, Cuff Size: Normal)   Pulse 71   Temp 98.7 F (37.1 C) (Temporal)   Ht 5\' 4"  (1.626 m)   Wt 120 lb (54.4 kg)   SpO2 97%   BMI 20.60 kg/m  Gen: NAD, well-appearing but anxious at times when thinking about her symptoms CV: RRR no murmurs rubs or gallops Lungs: CTAB no crackles, wheeze, rhonchi Abdomen: soft/nontender/nondistended/normal bowel sounds. No rebound or guarding.  Ext: no edema Skin: warm, dry    Assessment and Plan   # Anxiety/GAD S:Medication: Propranolol 20 mg TID PRN. Pt says it slows her heart rate down and calms her down. Tolerating well. 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 She would like to continue propranolol alone for now-continue current medication. -Hydroxyzine was listed as allergy by her team today but I would like for this to be clarified with PCP as sounds like issues may have been related to Zoloft instead -I still think trial of alternate SSRI would be reasonable with close follow-up but patient wanted hold off a month today he has close follow-up with PCP recommended  #Stomach Issues S: Symptoms ongoing for about a year. No dietary changes in last year- denies avoiding certain foods. Stomach issues usually start and promote anxiety afterwards.   Nausea episodes every day for a few days and may be days or weeks in between episodes. May also get headaches  with these but sometimes not but in the past has not been someone that gets headaches. 1-2 episodes of vomiting around time of antibiotics previously- not recently but feels like she could. A lot of burping. Constipation and diarrhea alternate. Feels warm compared to those around her when used to feel more regular. Noticed mild hair loss- seems to shed.  She has a fear of eating as she thinks she may feel poorly afterwards. Ultimately does eat and weight  up 5 lbs from last visit. No unintentional weight loss. Bread doesn't make her feel the best- chick fil a doesn't make her feel the best. Feels like she gets full faster than she used to. Tums doesn't help. nexium didn't help. Achy in shoulders at times when she gets nauseous. No blood in stool or emesis.   She has had several episodes of tonsillitis and tonsil stones and has had to be on different antibiotics. Mouth  wash has helped some. Symptoms worsened after antibiotics in the past but has continued but gotten better.   Feels mildly shaky in her hands. No recent tsh check . Brother with thyroid issues No results found for: TSH A/P: 24 year old female with multiple symptoms primarily GI in origin-nausea, belching, abdominal fullness, early satiety. Also has some other symptoms including shoulder achiness and heat intolerance -Check CBC, CMP, TSH -Evaluate for celiac disease-she does feel like her symptoms are worse with bread -If blood work is normal she would like to see gastroenterology for their opinion -Encouraged low FODMAP diet -Encourage follow-up with PCP-I do think anxiety could be playing a role but she is hesitant to retrial SSRI I did not do well on Zoloft-we'll defer to next visit with PCP which I encouraged within 2 to 4 weeks-virtual would likely be reasonable  Recommended follow up: 2 to 4 weeks with PCP or sooner if needed  Lab/Order associations:   ICD-10-CM   1. GAD (generalized anxiety disorder)  F41.1   2. Nausea  R11.0 Gliadin antibodies, serum    Tissue transglutaminase, IgA    Reticulin Antibody, IgA w reflex titer  3. Bloating  R14.0 CBC with Differential/Platelet    Comprehensive metabolic panel  4. Heat intolerance  R68.89 TSH   Time Spent: 33 minutes of total time (11:20 AM- 11:53 AM) was spent on the date of the encounter performing the following actions: chart review prior to seeing the patient, obtaining history, performing a medically necessary exam, counseling on the treatment plan, placing orders, and documenting in our EHR.   Return precautions advised.  Garret Reddish, MD

## 2019-10-03 NOTE — Patient Instructions (Addendum)
Please stop by lab before you go If you have mychart- we will send your results within 3 business days of [place] receiving them.  If you do not have mychart- we will call you about results within 5 business days of [place] receiving them.    I would like you to schedule a follow up visit with Jarold Motto, PA in 2-4 weeks to discuss next steps. Virtual follow up   Low-FODMAP Eating Plan  FODMAPs (fermentable oligosaccharides, disaccharides, monosaccharides, and polyols) are sugars that are hard for some people to digest. A low-FODMAP eating plan may help some people who have bowel (intestinal) diseases to manage their symptoms. This meal plan can be complicated to follow. Work with a diet and nutrition specialist (dietitian) to make a low-FODMAP eating plan that is right for you. A dietitian can make sure that you get enough nutrition from this diet. What are tips for following this plan? Reading food labels  Check labels for hidden FODMAPs such as: ? High-fructose syrup. ? Honey. ? Agave. ? Natural fruit flavors. ? Onion or garlic powder.  Choose low-FODMAP foods that contain 3-4 grams of fiber per serving.  Check food labels for serving sizes. Eat only one serving at a time to make sure FODMAP levels stay low. Meal planning  Follow a low-FODMAP eating plan for up to 6 weeks, or as told by your health care provider or dietitian.  To follow the eating plan: 1. Eliminate high-FODMAP foods from your diet completely. 2. Gradually reintroduce high-FODMAP foods into your diet one at a time. Most people should wait a few days after introducing one high-FODMAP food before they introduce the next high-FODMAP food. Your dietitian can recommend how quickly you may reintroduce foods. 3. Keep a daily record of what you eat and drink, and make note of any symptoms that you have after eating. 4. Review your daily record with a dietitian regularly. Your dietitian can help you identify which foods you can  eat and which foods you should avoid. General tips  Drink enough fluid each day to keep your urine pale yellow.  Avoid processed foods. These often have added sugar and may be high in FODMAPs.  Avoid most dairy products, whole grains, and sweeteners.  Work with a dietitian to make sure you get enough fiber in your diet. Recommended foods Grains  Gluten-free grains, such as rice, oats, buckwheat, quinoa, corn, polenta, and millet. Gluten-free pasta, bread, or cereal. Rice noodles. Corn tortillas. Vegetables  Eggplant, zucchini, cucumber, peppers, green beans, Brussels sprouts, bean sprouts, lettuce, arugula, kale, Swiss chard, spinach, collard greens, bok choy, summer squash, potato, and tomato. Limited amounts of corn, carrot, and sweet potato. Green parts of scallions. Fruits  Bananas, oranges, lemons, limes, blueberries, raspberries, strawberries, grapes, cantaloupe, honeydew melon, kiwi, papaya, passion fruit, and pineapple. Limited amounts of dried cranberries, banana chips, and shredded coconut. Dairy  Lactose-free milk, yogurt, and kefir. Lactose-free cottage cheese and ice cream. Non-dairy milks, such as almond, coconut, hemp, and rice milk. Yogurts made of non-dairy milks. Limited amounts of goat cheese, brie, mozzarella, parmesan, swiss, and other hard cheeses. Meats and other protein foods  Unseasoned beef, pork, poultry, or fish. Eggs. Tomasa Blase. Tofu (firm) and tempeh. Limited amounts of nuts and seeds, such as almonds, walnuts, [place] nuts, pecans, peanuts, pumpkin seeds, chia seeds, and sunflower seeds. Fats and oils  Butter-free spreads. Vegetable oils, such as olive, canola, and sunflower oil. Seasoning and other foods  Artificial sweeteners with names that do not end  in "ol" such as aspartame, saccharine, and stevia. Maple syrup, white table sugar, raw sugar, brown sugar, and molasses. Fresh basil, coriander, parsley, rosemary, and thyme. Beverages  Water and mineral  water. Sugar-sweetened soft drinks. Small amounts of orange juice or cranberry juice. Black and green tea. Most dry wines. Coffee. This may not be a complete list of low-FODMAP foods. Talk with your dietitian for more information. Foods to avoid Grains  Wheat, including kamut, durum, and semolina. Barley and bulgur. Couscous. Wheat-based cereals. Wheat noodles, bread, crackers, and pastries. Vegetables  Chicory root, artichoke, asparagus, cabbage, snow peas, sugar snap peas, mushrooms, and cauliflower. Onions, garlic, leeks, and the white part of scallions. Fruits  Fresh, dried, and juiced forms of apple, pear, watermelon, peach, plum, cherries, apricots, blackberries, boysenberries, figs, nectarines, and mango. Avocado. Dairy  Milk, yogurt, ice cream, and soft cheese. Cream and sour cream. Milk-based sauces. Custard. Meats and other protein foods  Fried or fatty meat. Sausage. Cashews and pistachios. Soybeans, baked beans, black beans, chickpeas, kidney beans, fava beans, navy beans, lentils, and split peas. Seasoning and other foods  Any sugar-free gum or candy. Foods that contain artificial sweeteners such as sorbitol, mannitol, isomalt, or xylitol. Foods that contain honey, high-fructose corn syrup, or agave. Bouillon, vegetable stock, beef stock, and chicken stock. Garlic and onion powder. Condiments made with onion, such as hummus, chutney, pickles, relish, salad dressing, and salsa. Tomato paste. Beverages  Chicory-based drinks. Coffee substitutes. Chamomile tea. Fennel tea. Sweet or fortified wines such as port or sherry. Diet soft drinks made with isomalt, mannitol, maltitol, sorbitol, or xylitol. Apple, pear, and mango juice. Juices with high-fructose corn syrup. This may not be a complete list of high-FODMAP foods. Talk with your dietitian to discuss what dietary choices are best for you.  Summary  A low-FODMAP eating plan is a short-term diet that eliminates FODMAPs from your  diet to help ease symptoms of certain bowel diseases.  The eating plan usually lasts up to 6 weeks. After that, high-FODMAP foods are restarted gradually, one at a time, so you can find out which may be causing symptoms.  A low-FODMAP eating plan can be complicated. It is best to work with a dietitian who has experience with this type of plan. This information is not intended to replace advice given to you by your health care provider. Make sure you discuss any questions you have with your health care provider. Document Revised: 04/10/2017 Document Reviewed: 12/23/2016 Elsevier Patient Education  [place].

## 2019-10-06 ENCOUNTER — Ambulatory Visit (INDEPENDENT_AMBULATORY_CARE_PROVIDER_SITE_OTHER): Payer: BC Managed Care – PPO | Admitting: Family Medicine

## 2019-10-06 ENCOUNTER — Other Ambulatory Visit: Payer: Self-pay

## 2019-10-06 ENCOUNTER — Encounter: Payer: Self-pay | Admitting: Family Medicine

## 2019-10-06 VITALS — BP 118/70 | HR 71 | Temp 98.7°F | Ht 64.0 in | Wt 120.0 lb

## 2019-10-06 DIAGNOSIS — R110 Nausea: Secondary | ICD-10-CM | POA: Diagnosis not present

## 2019-10-06 DIAGNOSIS — R6889 Other general symptoms and signs: Secondary | ICD-10-CM

## 2019-10-06 DIAGNOSIS — R140 Abdominal distension (gaseous): Secondary | ICD-10-CM

## 2019-10-06 DIAGNOSIS — F411 Generalized anxiety disorder: Secondary | ICD-10-CM

## 2019-10-06 LAB — COMPREHENSIVE METABOLIC PANEL
ALT: 14 U/L (ref 0–35)
AST: 15 U/L (ref 0–37)
Albumin: 4.4 g/dL (ref 3.5–5.2)
Alkaline Phosphatase: 36 U/L — ABNORMAL LOW (ref 39–117)
BUN: 10 mg/dL (ref 6–23)
CO2: 27 mEq/L (ref 19–32)
Calcium: 9.9 mg/dL (ref 8.4–10.5)
Chloride: 104 mEq/L (ref 96–112)
Creatinine, Ser: 0.73 mg/dL (ref 0.40–1.20)
GFR: 99.62 mL/min (ref >60.00)
Glucose, Bld: 66 mg/dL — ABNORMAL LOW (ref 70–99)
Potassium: 4.1 mEq/L (ref 3.5–5.1)
Sodium: 138 mEq/L (ref 135–145)
Total Bilirubin: 0.4 mg/dL (ref 0.2–1.2)
Total Protein: 6.9 g/dL (ref 6.0–8.3)

## 2019-10-06 LAB — CBC WITH DIFFERENTIAL/PLATELET
Basophils Absolute: 0.1 10*3/uL (ref 0.0–0.1)
Basophils Relative: 1 % (ref 0.0–3.0)
Eosinophils Absolute: 0.1 10*3/uL (ref 0.0–0.7)
Eosinophils Relative: 2.2 % (ref 0.0–5.0)
HCT: 38.3 % (ref 36.0–46.0)
Hemoglobin: 13.2 g/dL (ref 12.0–15.0)
Lymphocytes Relative: 28.5 % (ref 12.0–46.0)
Lymphs Abs: 1.5 10*3/uL (ref 0.7–4.0)
MCHC: 34.4 g/dL (ref 30.0–36.0)
MCV: 89.1 fl (ref 78.0–100.0)
Monocytes Absolute: 0.4 10*3/uL (ref 0.1–1.0)
Monocytes Relative: 8 % (ref 3.0–12.0)
Neutro Abs: 3.3 10*3/uL (ref 1.4–7.7)
Neutrophils Relative %: 60.3 % (ref 43.0–77.0)
Platelets: 361 10*3/uL (ref 150.0–400.0)
RBC: 4.3 Mil/uL (ref 3.87–5.11)
RDW: 12.6 % (ref 11.5–15.5)
WBC: 5.4 10*3/uL (ref 4.0–10.5)

## 2019-10-06 LAB — TSH: TSH: 1.09 u[IU]/mL (ref 0.35–4.50)

## 2019-10-07 ENCOUNTER — Telehealth: Payer: Self-pay | Admitting: Physician Assistant

## 2019-10-07 NOTE — Telephone Encounter (Signed)
This is a worley pt, see below.

## 2019-10-07 NOTE — Telephone Encounter (Signed)
Duplicate

## 2019-10-07 NOTE — Telephone Encounter (Signed)
Called pt and scheduled virtual visit with Jarold Motto

## 2019-10-07 NOTE — Telephone Encounter (Signed)
Pt called asking for results on lab work from 10/07/2019. Pt states she saw the results and Dr. Pamala Hurry comments. Pt asked what are the next steps she should take due to her symptoms continuing and blood work coming back normal. Please advise.

## 2019-10-07 NOTE — Telephone Encounter (Signed)
Spoke to pt told her being as her labs came back normal and you did not want to start any medication when Dr. Durene Cal discussed it. He said to follow up in 2-4 weeks with [facility]. If you want to discuss medication now since labs were normal I would suggest to schedule an appt to see Lelon Mast next week. Pt verbalized understanding and would like to discuss medication for anxiety to see if it will help my symptoms. Told pt I will have the schedulers call you to schedule for next week. Pt verbalized understanding.

## 2019-10-07 NOTE — Telephone Encounter (Signed)
Please call pt and schedule her to see [facility].

## 2019-10-07 NOTE — Telephone Encounter (Signed)
Patient notified of labs and was told that everything is normal. She was advised that her sugar levels were low which can make you feel bad. Patient notified to contact office if symptoms worsen.

## 2019-10-11 ENCOUNTER — Telehealth (INDEPENDENT_AMBULATORY_CARE_PROVIDER_SITE_OTHER): Payer: BC Managed Care – PPO | Admitting: Physician Assistant

## 2019-10-11 ENCOUNTER — Encounter: Payer: Self-pay | Admitting: Physician Assistant

## 2019-10-11 VITALS — Ht 64.0 in | Wt 120.0 lb

## 2019-10-11 DIAGNOSIS — R110 Nausea: Secondary | ICD-10-CM | POA: Diagnosis not present

## 2019-10-11 DIAGNOSIS — F411 Generalized anxiety disorder: Secondary | ICD-10-CM

## 2019-10-11 MED ORDER — SUCRALFATE 1 G PO TABS
1.0000 g | ORAL_TABLET | Freq: Three times a day (TID) | ORAL | 0 refills | Status: DC
Start: 2019-10-11 — End: 2019-12-15

## 2019-10-11 MED ORDER — PANTOPRAZOLE SODIUM 40 MG PO TBEC
40.0000 mg | DELAYED_RELEASE_TABLET | Freq: Every day | ORAL | 3 refills | Status: DC
Start: 2019-10-11 — End: 2019-12-15

## 2019-10-11 NOTE — Progress Notes (Signed)
Virtual Visit via Video   I connected with Melinda Newman on 10/11/19 at 11:30 AM EDT by a video enabled telemedicine application and verified that I am speaking with the correct person using two identifiers. Location patient: Home Location provider: [facility], Office Persons participating in the virtual visit: Melinda Newman, [place] PA-C  I discussed the limitations of evaluation and management by telemedicine and the availability of in person appointments. The patient expressed understanding and agreed to proceed.   Subjective:   HPI:   Nausea Continues to have nausea with early satiety after meals. Saw another provider in our office on 10/06/19 for this. Had negative lab tests, including negative celiac panel. She denies bright red rectal bleeding but is having "black stools about half the time." Intermittent ongoing diarrhea/constipation.  Very limited alcohol intake. Symptoms are affecting her quality of life because they are unpredictable and are therefore causing anxiety.  She is taking propranolol for her anxiety and is tolerating this well. Doesn't take daily. She is about to start a new job next week.  Denies SI/HI.  ROS: See pertinent positives and negatives per HPI.  Patient Active Problem List   Diagnosis Date Noted  . GAD (generalized anxiety disorder) 04/12/2019    Social History   Tobacco Use  . Smoking status: Never Smoker  . Smokeless tobacco: Never Used  Substance Use Topics  . Alcohol use: Yes    Comment: occ    Current Outpatient Medications:  .  Norethindrone Acetate-Ethinyl Estradiol (JUNEL 1.5/30) 1.5-30 MG-MCG tablet, Take 1 tablet by mouth daily., Disp: 3 Package, Rfl: 3 .  propranolol (INDERAL) 20 MG tablet, TAKE 1 TABLET (20 MG TOTAL) BY MOUTH 3 (THREE) TIMES DAILY AS NEEDED (ANXIETY)., Disp: 30 tablet, Rfl: 0 .  pantoprazole (PROTONIX) 40 MG tablet, Take 1 tablet (40 mg total) by mouth daily., Disp: 30 tablet, Rfl: 3 .   sucralfate (CARAFATE) 1 g tablet, Take 1 tablet (1 g total) by mouth 4 (four) times daily -  with meals and at bedtime for 14 days., Disp: 56 tablet, Rfl: 0  Allergies  Allergen Reactions  . Hydroxyzine Other (See Comments)    Fainting spells    Objective:   VITALS: Per patient if applicable, see vitals. GENERAL: Alert, appears well and in no acute distress. HEENT: Atraumatic, conjunctiva clear, no obvious abnormalities on inspection of external nose and ears. NECK: Normal movements of the head and neck. CARDIOPULMONARY: No increased WOB. Speaking in clear sentences. I:E ratio WNL.  MS: Moves all visible extremities without noticeable abnormality. PSYCH: Pleasant and cooperative, well-groomed. Speech normal rate and rhythm. Affect is appropriate. Insight and judgement are appropriate. Attention is focused, linear, and appropriate.  NEURO: CN grossly intact. Oriented as arrived to appointment on time with no prompting. Moves both UE equally.  SKIN: No obvious lesions, wounds, erythema, or cyanosis noted on face or hands.  Assessment and Plan:   Melinda Newman was seen today for anxiety.  Diagnoses and all orders for this visit:  Nausea Unclear etiology, possible gastritis, ulcer, IBS, or other. Will trial oral carafate, protonix, and continued bland diet. I strongly recommend referral to GI at this point, to which she is agreeable -- will place. Worsening precautions advised in the interim. -     Ambulatory referral to Gastroenterology  GAD (generalized anxiety disorder) Discussed treatment options of starting a different SSRI, but she would like to continue propranolol for now. Continue to monitor.   Other orders -  sucralfate (CARAFATE) 1 g tablet; Take 1 tablet (1 g total) by mouth 4 (four) times daily -  with meals and at bedtime for 14 days. -     pantoprazole (PROTONIX) 40 MG tablet; Take 1 tablet (40 mg total) by mouth daily.    . Reviewed expectations re: course of current  medical issues. . Discussed self-management of symptoms. . Outlined signs and symptoms indicating need for more acute intervention. . Patient verbalized understanding and all questions were answered. Marland Kitchen Health Maintenance issues including appropriate healthy diet, exercise, and smoking avoidance were discussed with patient. . See orders for this visit as documented in the electronic medical record.  I discussed the assessment and treatment plan with the patient. The patient was provided an opportunity to ask questions and all were answered. The patient agreed with the plan and demonstrated an understanding of the instructions.   The patient was advised to call back or seek an in-person evaluation if the symptoms worsen or if the condition fails to improve as anticipated.   CMA or LPN served as scribe during this visit. History, Physical, and Plan performed by medical provider. The above documentation has been reviewed and is accurate and complete.  I spent 25 minutes with this patient, greater than 50% was face-to-face time counseling regarding the above diagnoses.  [place], [place] 10/11/2019

## 2019-10-13 LAB — TISSUE TRANSGLUTAMINASE, IGA: (tTG) Ab, IgA: 2 U/mL

## 2019-10-13 LAB — GLIADIN ANTIBODIES, SERUM
Gliadin IgA: 3 Units
Gliadin IgG: 1 Units

## 2019-10-13 LAB — RETICULIN ANTIBODIES, IGA W TITER: Reticulin IGA Screen: NEGATIVE

## 2019-11-02 ENCOUNTER — Other Ambulatory Visit: Payer: Self-pay | Admitting: Physician Assistant

## 2019-11-02 ENCOUNTER — Other Ambulatory Visit: Payer: Self-pay | Admitting: Family Medicine

## 2019-11-29 DIAGNOSIS — R202 Paresthesia of skin: Secondary | ICD-10-CM | POA: Diagnosis not present

## 2019-11-29 DIAGNOSIS — M62838 Other muscle spasm: Secondary | ICD-10-CM | POA: Diagnosis not present

## 2019-12-07 ENCOUNTER — Other Ambulatory Visit: Payer: Self-pay | Admitting: Physician Assistant

## 2019-12-15 ENCOUNTER — Encounter: Payer: Self-pay | Admitting: Family Medicine

## 2019-12-15 ENCOUNTER — Telehealth (INDEPENDENT_AMBULATORY_CARE_PROVIDER_SITE_OTHER): Payer: BC Managed Care – PPO | Admitting: Family Medicine

## 2019-12-15 DIAGNOSIS — K5900 Constipation, unspecified: Secondary | ICD-10-CM

## 2019-12-15 DIAGNOSIS — R519 Headache, unspecified: Secondary | ICD-10-CM | POA: Diagnosis not present

## 2019-12-15 NOTE — Progress Notes (Signed)
Virtual Visit via Video Note  I connected with Melinda Newman  on 12/15/19 at  5:20 PM EDT by a video enabled telemedicine application and verified that I am speaking with the correct person using two identifiers.  Location patient: home, [place] Location provider:work or home office Persons participating in the virtual visit: patient, provider  I discussed the limitations of evaluation and management by telemedicine and the availability of in person appointments. The patient expressed understanding and agreed to proceed.   HPI:  Acute visit for chronic indigestion: -started about 1 year ago with fluctuating intermittent abd pain, bouts of diarrhea or constipation (usually constipation), sometimes has headaches when has the stomach issues, nausea, vomiting -symptoms the last 1.5 weeks include bloating, constipation, intermittent pain in her upper abdomen, she has had some headaches and lower back pain this last week too, decreased appetite - did have a few episodes of emesis at the onset, none since -denies fevers, resp symptoms, melena, hematochezia, persistent vomiting, malaise -she takes ibuprofen and that helps -saw PCP and had a bunch of blood work for stomach issues a few months ago and all looked ok -she saw her PCP again and was given some medicine for stomach ulcer and indigestion -reports the plan was to then have her see a GI specialist if symptoms persisted, reports they referred her, but has not gotten an appointment yet -on oral contraception  ROS: See pertinent positives and negatives per HPI.  History reviewed. No pertinent past medical history.  History reviewed. No pertinent surgical history.  Family History  Problem Relation Age of Onset  . Multiple sclerosis Mother   . Stroke Maternal Grandmother   . Skin cancer Maternal Grandfather   . Diabetes Maternal Grandfather   . Heart disease Maternal Grandfather   . Skin cancer Paternal Grandmother   . Skin cancer Paternal  Grandfather   . Colon cancer Neg Hx   . Breast cancer Neg Hx     SOCIAL HX: see hpi   Current Outpatient Medications:  .  JUNEL 1.5/30 1.5-30 MG-MCG tablet, TAKE 1 TABLET BY MOUTH EVERY DAY, Disp: 63 tablet, Rfl: 3 .  propranolol (INDERAL) 20 MG tablet, TAKE 1 TABLET (20 MG TOTAL) BY MOUTH 3 (THREE) TIMES DAILY AS NEEDED (ANXIETY)., Disp: 30 tablet, Rfl: 0  EXAM:  VITALS per patient if applicable:  GENERAL: alert, oriented, appears well and in no acute distress  HEENT: atraumatic, conjunttiva clear, no obvious abnormalities on inspection of external nose and ears  NECK: normal movements of the head and neck  LUNGS: on inspection no signs of respiratory distress, breathing rate appears normal, no obvious gross SOB, gasping or wheezing  CV: no obvious cyanosis  MS: moves all visible extremities without noticeable abnormality  PSYCH/NEURO: pleasant and cooperative, no obvious depression or anxiety, speech and thought processing grossly intact  ASSESSMENT AND PLAN:  Discussed the following assessment and plan:  Constipation, unspecified constipation type  -we discussed possible serious and likely etiologies, options for evaluation and workup, limitations of telemedicine visit vs in person visit, treatment, treatment risks and precautions. Pt prefers to treat via telemedicine empirically rather then risking or undertaking an in person visit at this moment. Discussed at length her numerous symptoms and potential etiologies. IBS vs atypical migraine is high on the list of potential etiologies vs other. She had fairly ext eval with PCP. Advised will send message to PCP office to check on the GI referral status. Constipation seems to be the main issues right now and opted  to try mirilax over the next week to get bowel emptied out. Also trial off dairy and probiotic. Follow up with PCP in 2-4 weeks.  Message sent to PCP office to assist. Advised to seek prompt care sooner  if worsening, new  symptoms arise, or if is not improving with treatment.   I discussed the assessment and treatment plan with the patient. The patient was provided an opportunity to ask questions and all were answered. The patient agreed with the plan and demonstrated an understanding of the instructions.   The patient was advised to call back or seek an in-person evaluation if the symptoms worsen or if the condition fails to improve as anticipated.   Terressa Koyanagi, DO

## 2019-12-16 NOTE — Progress Notes (Signed)
Patient is scheduled virtually for 01/03/20 and was notified to call [facility] about her referral to a GI doctor.

## 2020-01-02 ENCOUNTER — Other Ambulatory Visit: Payer: Self-pay | Admitting: Physician Assistant

## 2020-01-03 ENCOUNTER — Telehealth: Payer: BC Managed Care – PPO | Admitting: Physician Assistant

## 2020-01-27 IMAGING — CT CT CHEST W/O CM
2 of 4 series · 15 of 36 positions shown, 18 images · non-contrast
Comparison: Contrast enhanced CT from earlier in the same day.

CLINICAL DATA: Known pneumomediastinum, evaluate for possible
esophageal injury

EXAM:
CT CHEST WITHOUT CONTRAST
TECHNIQUE: Multidetector CT imaging of the chest was performed following the
standard protocol without IV contrast. Additionally Gastrografin was
administered orally during the exam.

[Series 2: thorax · axial · 0.61mm/px · z∈[-328,-50]mm · 12 of 161 slices shown, 15 images]
[im 11/161  mediastinal]
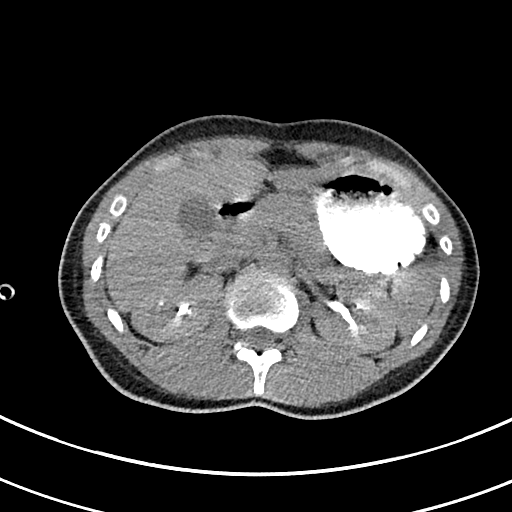
[im 11/161  lung]
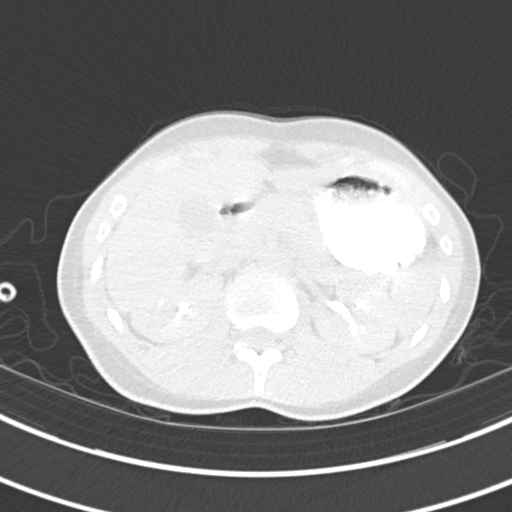
[im 22/161  lung]
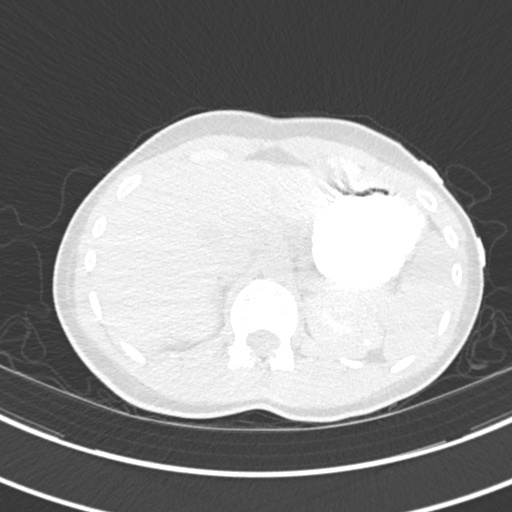
[im 33/161  lung]
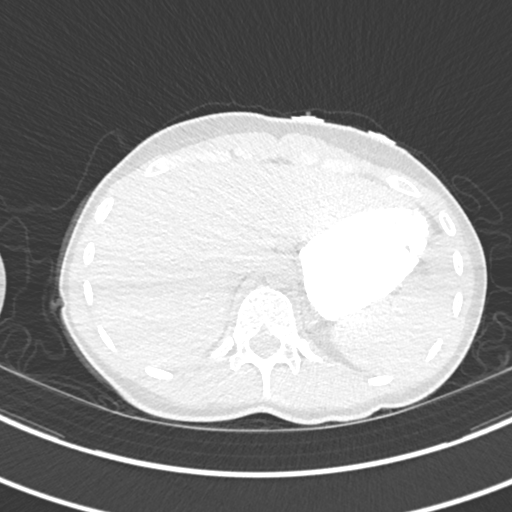
[im 54/161  lung]
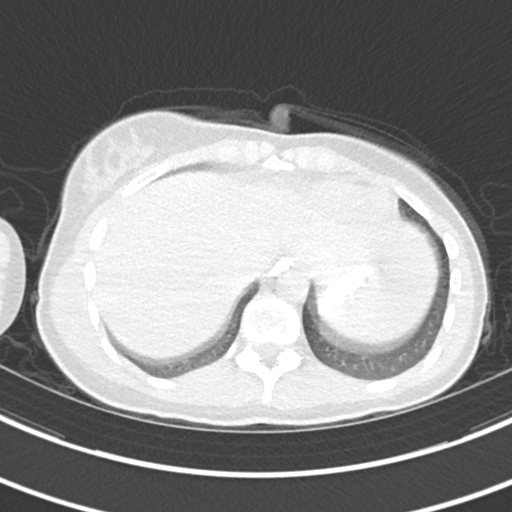
[im 65/161  mediastinal]
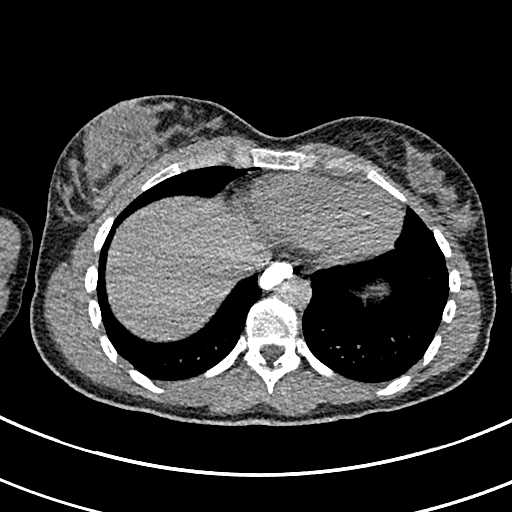
[im 65/161  lung]
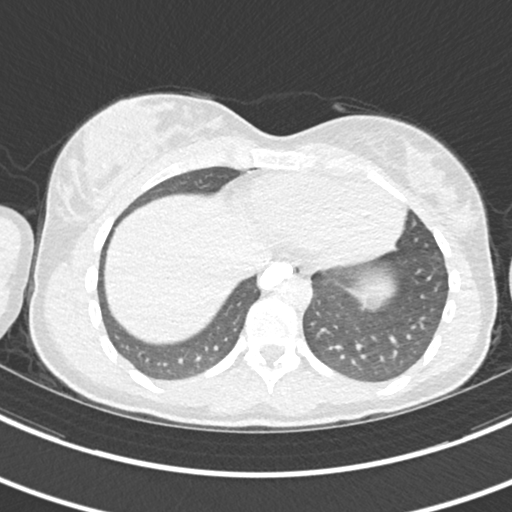
[im 75/161  lung]
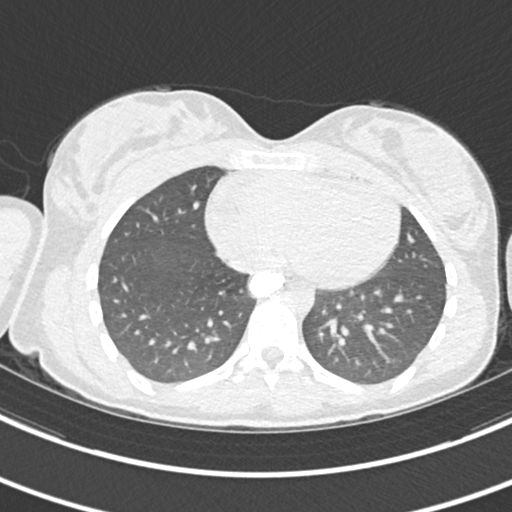
[im 86/161  lung]
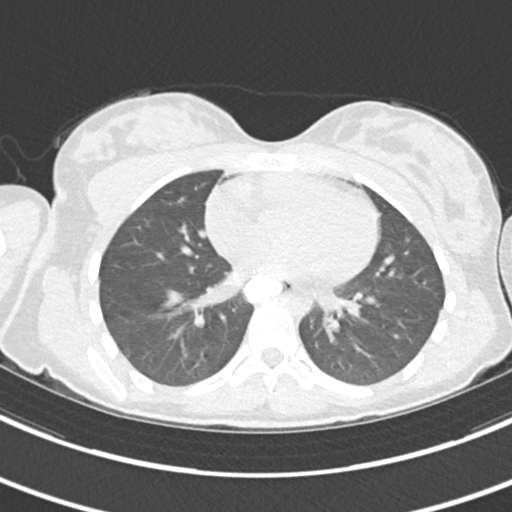
[im 97/161  lung]
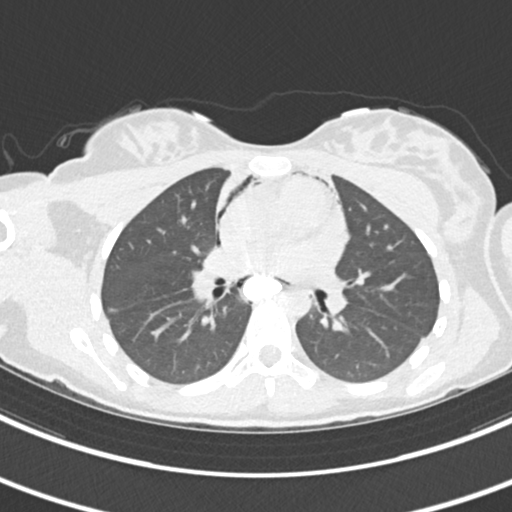
[im 107/161  mediastinal]
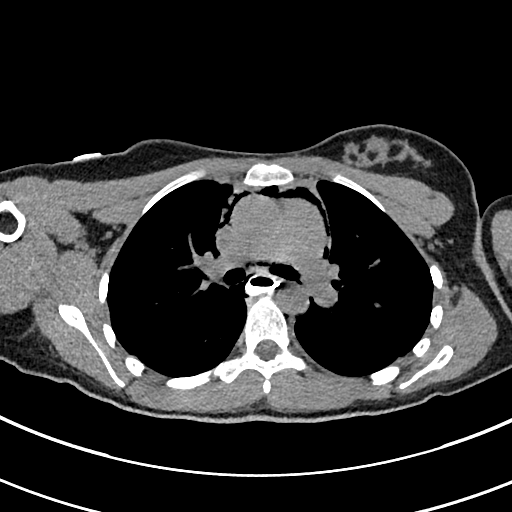
[im 107/161  lung]
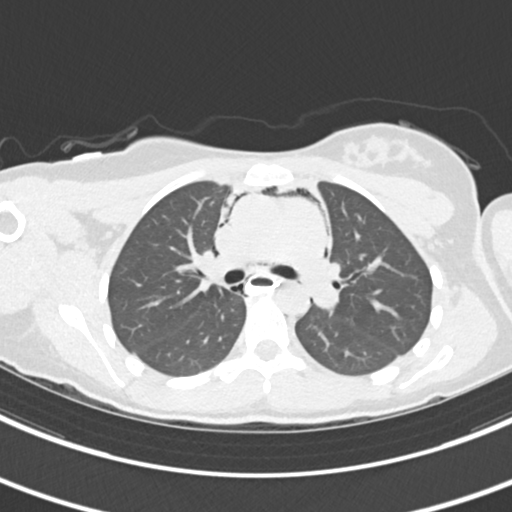
[im 129/161  lung]
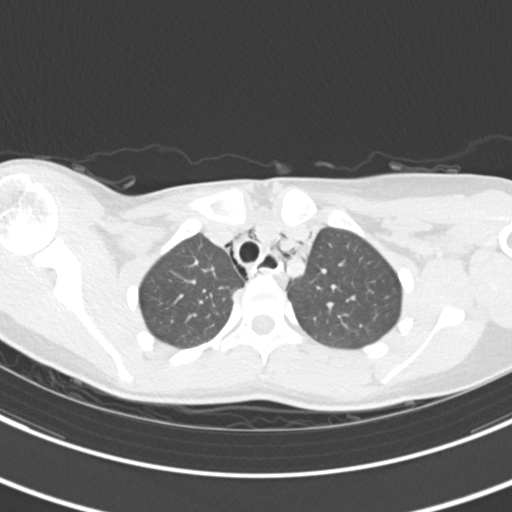
[im 139/161  lung]
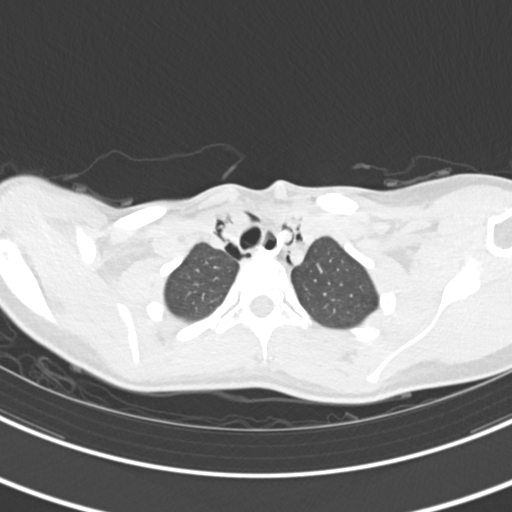
[im 150/161  lung]
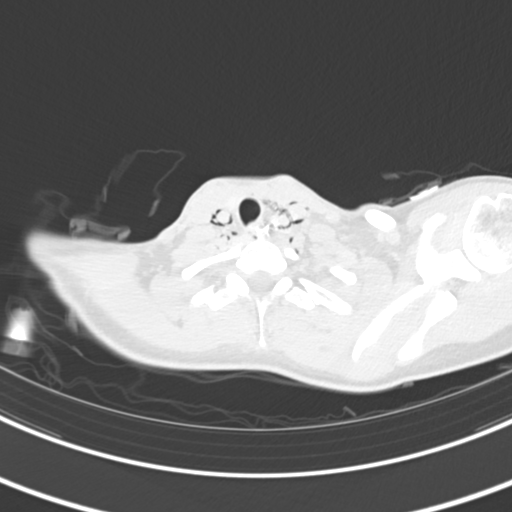

[Series 5: coronal · coronal · 0.63mm/px · 3 of 103 slices shown]
[im 21/103  lung]
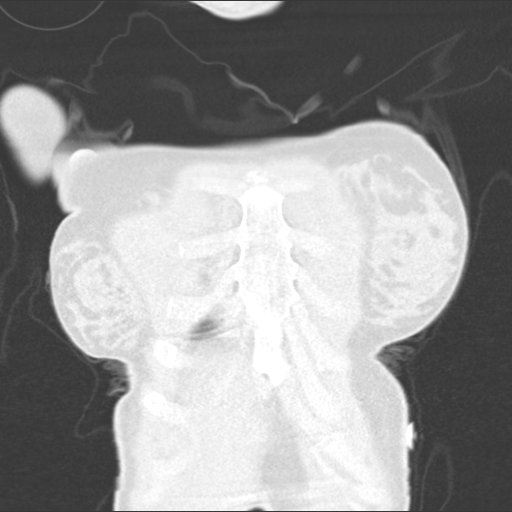
[im 41/103  lung]
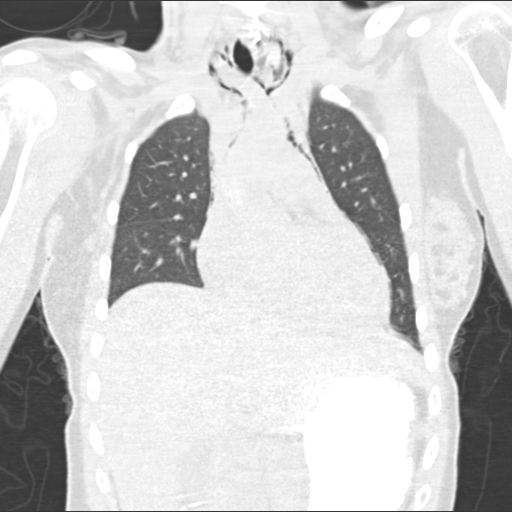
[im 62/103  lung]
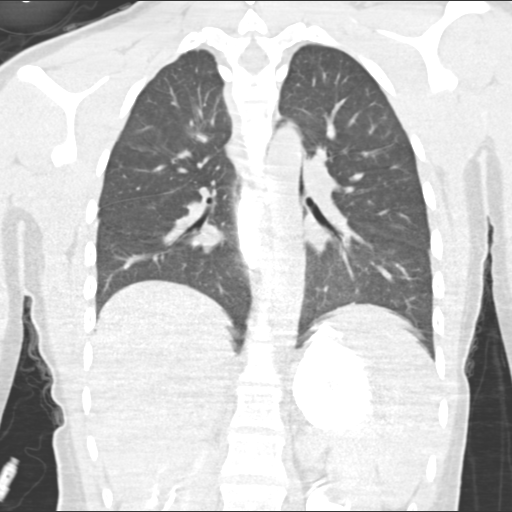

[15 of 36 positions shown; findings below may reference images not displayed]

FINDINGS: Cardiovascular: Cardiac shadow is within normal limits. No IV
contrast was administered for this exam. No other focal vascular
abnormality is noted.

Mediastinum/Nodes: Pneumomediastinum is again identified. The
esophagus shows dense Gastrografin contrast within although no area
of extravasation is identified. Repeat imaging to include the entire
gastroesophageal junction was performed. These images show no
extravasation of contrast material.

Lungs/Pleura: The lungs are again well aerated without focal
infiltrate or sizable effusion. No pneumothorax is seen.

Upper Abdomen: Visualized upper abdomen is limited due to the lack
of IV contrast. The stomach is full of ingested food stuffs.

Musculoskeletal: No acute bony abnormality is noted.
IMPRESSION: No evidence of significant extravasation of ingested contrast to
suggest esophageal injury. Pneumomediastinum is again identified and
stable.

## 2020-06-06 DIAGNOSIS — U071 COVID-19: Secondary | ICD-10-CM | POA: Diagnosis not present

## 2020-06-06 DIAGNOSIS — Z20822 Contact with and (suspected) exposure to covid-19: Secondary | ICD-10-CM | POA: Diagnosis not present

## 2020-06-06 DIAGNOSIS — J029 Acute pharyngitis, unspecified: Secondary | ICD-10-CM | POA: Diagnosis not present

## 2020-06-06 DIAGNOSIS — Z03818 Encounter for observation for suspected exposure to other biological agents ruled out: Secondary | ICD-10-CM | POA: Diagnosis not present

## 2020-06-06 DIAGNOSIS — J069 Acute upper respiratory infection, unspecified: Secondary | ICD-10-CM | POA: Diagnosis not present

## 2020-10-07 ENCOUNTER — Other Ambulatory Visit: Payer: Self-pay | Admitting: Physician Assistant

## 2020-12-12 ENCOUNTER — Other Ambulatory Visit: Payer: Self-pay | Admitting: Family Medicine

## 2021-02-11 ENCOUNTER — Other Ambulatory Visit: Payer: Self-pay | Admitting: Physician Assistant

## 2021-04-11 ENCOUNTER — Other Ambulatory Visit: Payer: Self-pay | Admitting: Physician Assistant

## 2021-04-12 ENCOUNTER — Telehealth (INDEPENDENT_AMBULATORY_CARE_PROVIDER_SITE_OTHER): Payer: BC Managed Care – PPO | Admitting: Physician Assistant

## 2021-04-12 ENCOUNTER — Encounter: Payer: Self-pay | Admitting: Physician Assistant

## 2021-04-12 VITALS — Ht 64.0 in | Wt 125.0 lb

## 2021-04-12 DIAGNOSIS — F411 Generalized anxiety disorder: Secondary | ICD-10-CM

## 2021-04-12 DIAGNOSIS — F40243 Fear of flying: Secondary | ICD-10-CM | POA: Diagnosis not present

## 2021-04-12 MED ORDER — PROPRANOLOL HCL 20 MG PO TABS: 20.0000 mg | ORAL_TABLET | Freq: Three times a day (TID) | ORAL | 1 refills | Status: AC | PRN

## 2021-04-12 MED ORDER — LORAZEPAM 0.5 MG PO TABS: 0.5000 mg | ORAL_TABLET | Freq: Two times a day (BID) | ORAL | 0 refills | Status: AC | PRN

## 2021-04-12 NOTE — Progress Notes (Signed)
   Virtual Visit via Video Note   I, Jarold Motto, connected with  Melinda Newman  ([id number], [birth year]) on 04/12/21 at 10:30 AM EST by a video-enabled telemedicine application and verified that I am speaking with the correct person using two identifiers.  Location: Patient: Virtual Visit Location Patient: Home Provider: Virtual Visit Location Provider: Office/Clinic   I discussed the limitations of evaluation and management by telemedicine and the availability of in person appointments. The patient expressed understanding and agreed to proceed.    History of Present Illness: Melinda Newman is a 25 y.o. who identifies as a female who was assigned female at birth, and is being seen today for anxiety.  Anxiety Symptoms have improved with time, but still having mild symptoms. Has not been taking propranolol 20 mg because she has not had access to this, but would like a refill. She is planning to go out of the country on her first flight ever out of the country soon. She is very anxious about this. She recently went on a ferris wheel, she had a panic attack while on this. Denies: SI/HI.   Problems:  Patient Active Problem List   Diagnosis Date Noted   GAD (generalized anxiety disorder) 04/12/2019    Allergies:  Allergies  Allergen Reactions   Hydroxyzine Other (See Comments)    Fainting spells   Medications:  Current Outpatient Medications:    JUNEL 1.5/30 1.5-30 MG-MCG tablet, TAKE 1 TABLET BY MOUTH EVERY DAY, Disp: 63 tablet, Rfl: 0   LORazepam (ATIVAN) 0.5 MG tablet, Take 1 tablet (0.5 mg total) by mouth 2 (two) times daily as needed for anxiety (flying)., Disp: 20 tablet, Rfl: 0   propranolol (INDERAL) 20 MG tablet, Take 1 tablet (20 mg total) by mouth 3 (three) times daily as needed (anxiety)., Disp: 60 tablet, Rfl: 1  Observations/Objective: Patient is well-developed, well-nourished in no acute distress.  Resting comfortably  at home.  Head is normocephalic,  atraumatic.  No labored breathing.  Speech is clear and coherent with logical content.  Patient is alert and oriented at baseline.    Assessment and Plan: 1. GAD (generalized anxiety disorder) Overall well-controlled Refill propranolol 20 mg TID as needed Follow-up in 6 months, sooner if concerns  2. Anxiety with flying Ativan discussed and sent in for patient May take 1 ativan tablet 30 min prior to flight, may take another during flight if needed Follow-up as needed  Follow Up Instructions: I discussed the assessment and treatment plan with the patient. The patient was provided an opportunity to ask questions and all were answered. The patient agreed with the plan and demonstrated an understanding of the instructions.  A copy of instructions were sent to the patient via MyChart unless otherwise noted below.   The patient was advised to call back or seek an in-person evaluation if the symptoms worsen or if the condition fails to improve as anticipated.  Time:  I spent 15 minutes with the patient via telehealth technology discussing the above problems/concerns.    Jarold Motto, [place]

## 2021-04-12 NOTE — Patient Instructions (Addendum)
It was great to see you!  Use propranolol as needed for mild panic attack or anxiety symptoms during the day  Use ativan for severe panic attack symptoms or for flying May take 1 ativan tablet 30 min prior to flight, may take another during flight if needed  Take care,  Jarold Motto PA-C

## 2021-06-14 ENCOUNTER — Other Ambulatory Visit: Payer: Self-pay | Admitting: Physician Assistant

## 2021-07-23 DIAGNOSIS — L700 Acne vulgaris: Secondary | ICD-10-CM | POA: Diagnosis not present

## 2021-08-13 ENCOUNTER — Other Ambulatory Visit: Payer: Self-pay | Admitting: Physician Assistant

## 2021-10-14 ENCOUNTER — Other Ambulatory Visit: Payer: Self-pay | Admitting: Physician Assistant

## 2021-12-09 ENCOUNTER — Encounter: Payer: Self-pay | Admitting: Physician Assistant

## 2021-12-09 DIAGNOSIS — R300 Dysuria: Secondary | ICD-10-CM | POA: Diagnosis not present

## 2021-12-09 DIAGNOSIS — Z3202 Encounter for pregnancy test, result negative: Secondary | ICD-10-CM | POA: Diagnosis not present

## 2021-12-13 DIAGNOSIS — L700 Acne vulgaris: Secondary | ICD-10-CM | POA: Diagnosis not present

## 2021-12-14 ENCOUNTER — Other Ambulatory Visit: Payer: Self-pay | Admitting: Physician Assistant

## 2022-01-20 DIAGNOSIS — S60221A Contusion of right hand, initial encounter: Secondary | ICD-10-CM | POA: Diagnosis not present

## 2022-01-20 DIAGNOSIS — S66411A Strain of intrinsic muscle, fascia and tendon of right thumb at wrist and hand level, initial encounter: Secondary | ICD-10-CM | POA: Diagnosis not present

## 2022-01-20 DIAGNOSIS — M79641 Pain in right hand: Secondary | ICD-10-CM | POA: Diagnosis not present

## 2022-02-15 ENCOUNTER — Other Ambulatory Visit: Payer: Self-pay | Admitting: Physician Assistant

## 2022-04-07 ENCOUNTER — Other Ambulatory Visit: Payer: Self-pay | Admitting: Physician Assistant

## 2022-04-07 ENCOUNTER — Encounter: Payer: Self-pay | Admitting: Physician Assistant

## 2022-04-08 MED ORDER — NORETHINDRONE ACET-ETHINYL EST 1.5-30 MG-MCG PO TABS: 1.0000 | ORAL_TABLET | Freq: Every day | ORAL | 0 refills | Status: AC

## 2022-05-21 DIAGNOSIS — L700 Acne vulgaris: Secondary | ICD-10-CM | POA: Diagnosis not present

## 2022-09-08 DIAGNOSIS — J209 Acute bronchitis, unspecified: Secondary | ICD-10-CM | POA: Diagnosis not present

## 2022-09-22 DIAGNOSIS — Z3041 Encounter for surveillance of contraceptive pills: Secondary | ICD-10-CM | POA: Diagnosis not present

## 2022-09-22 DIAGNOSIS — Z6821 Body mass index (BMI) 21.0-21.9, adult: Secondary | ICD-10-CM | POA: Diagnosis not present

## 2022-09-22 DIAGNOSIS — R87612 Low grade squamous intraepithelial lesion on cytologic smear of cervix (LGSIL): Secondary | ICD-10-CM | POA: Diagnosis not present

## 2022-09-22 DIAGNOSIS — Z01419 Encounter for gynecological examination (general) (routine) without abnormal findings: Secondary | ICD-10-CM | POA: Diagnosis not present

## 2022-10-15 DIAGNOSIS — L700 Acne vulgaris: Secondary | ICD-10-CM | POA: Diagnosis not present

## 2022-11-03 DIAGNOSIS — R87612 Low grade squamous intraepithelial lesion on cytologic smear of cervix (LGSIL): Secondary | ICD-10-CM | POA: Diagnosis not present

## 2022-11-03 DIAGNOSIS — Z6821 Body mass index (BMI) 21.0-21.9, adult: Secondary | ICD-10-CM | POA: Diagnosis not present

## 2022-11-03 DIAGNOSIS — R87810 Cervical high risk human papillomavirus (HPV) DNA test positive: Secondary | ICD-10-CM | POA: Diagnosis not present
